# Patient Record
Sex: Female | Born: 1980 | Hispanic: Yes | Marital: Single | Smoking: Never smoker
Health system: Southern US, Community
[De-identification: ages and names within clinical notes are randomized; demographics above are authoritative.]

---

## 2018-07-18 ENCOUNTER — Observation Stay
Admission: EM | Admit: 2018-07-18 | Discharge: 2018-07-19 | Disposition: A | Discharge status: Home / Self Care | Payer: Self-pay | Attending: Surgery | Admitting: Surgery

## 2018-07-18 ENCOUNTER — Emergency Department: Payer: Self-pay

## 2018-07-18 DIAGNOSIS — Z79899 Other long term (current) drug therapy: Secondary | ICD-10-CM | POA: Insufficient documentation

## 2018-07-18 DIAGNOSIS — K8012 Calculus of gallbladder with acute and chronic cholecystitis without obstruction: Principal | ICD-10-CM | POA: Insufficient documentation

## 2018-07-18 DIAGNOSIS — Z881 Allergy status to other antibiotic agents status: Secondary | ICD-10-CM | POA: Insufficient documentation

## 2018-07-18 DIAGNOSIS — K810 Acute cholecystitis: Secondary | ICD-10-CM

## 2018-07-18 DIAGNOSIS — R1013 Epigastric pain: Secondary | ICD-10-CM

## 2018-07-18 LAB — CBC
HEMATOCRIT: 39.8 % (ref 36.0–46.0)
HEMOGLOBIN: 13.4 g/dL (ref 12.0–15.0)
MCH: 30.2 pg (ref 26.0–34.0)
MCHC: 33.7 g/dL (ref 30.0–36.0)
MCV: 89.8 fL (ref 80.0–100.0)
NRBC: 0 % (ref 0.0–0.2)
Platelets: 343 10*3/uL (ref 150–400)
RBC: 4.43 MIL/uL (ref 3.87–5.11)
RDW: 12.9 % (ref 11.5–15.5)
WBC: 14.1 10*3/uL — AB (ref 4.0–10.5)

## 2018-07-18 LAB — COMPREHENSIVE METABOLIC PANEL
ALT: 20 U/L (ref 0–44)
AST: 18 U/L (ref 15–41)
Albumin: 4.4 g/dL (ref 3.5–5.0)
Alkaline Phosphatase: 57 U/L (ref 38–126)
Anion gap: 9 (ref 5–15)
BUN: 7 mg/dL (ref 6–20)
CHLORIDE: 104 mmol/L (ref 98–111)
CO2: 26 mmol/L (ref 22–32)
CREATININE: 0.56 mg/dL (ref 0.44–1.00)
Calcium: 9.1 mg/dL (ref 8.9–10.3)
Glucose, Bld: 116 mg/dL — ABNORMAL HIGH (ref 70–99)
POTASSIUM: 3.6 mmol/L (ref 3.5–5.1)
Sodium: 139 mmol/L (ref 135–145)
Total Bilirubin: 0.5 mg/dL (ref 0.3–1.2)
Total Protein: 7.9 g/dL (ref 6.5–8.1)

## 2018-07-18 LAB — URINALYSIS, COMPLETE (UACMP) WITH MICROSCOPIC
BILIRUBIN URINE: NEGATIVE
Glucose, UA: NEGATIVE mg/dL
KETONES UR: NEGATIVE mg/dL
LEUKOCYTES UA: NEGATIVE
NITRITE: NEGATIVE
PROTEIN: NEGATIVE mg/dL
SPECIFIC GRAVITY, URINE: 1.008 (ref 1.005–1.030)
pH: 6 (ref 5.0–8.0)

## 2018-07-18 LAB — LIPASE, BLOOD: LIPASE: 20 U/L (ref 11–51)

## 2018-07-18 LAB — TROPONIN I

## 2018-07-18 LAB — POCT PREGNANCY, URINE: PREG TEST UR: NEGATIVE

## 2018-07-18 MED ORDER — HYDROMORPHONE HCL 1 MG/ML IJ SOLN
1.0000 mg | Freq: Once | INTRAMUSCULAR | Status: AC
  Administered 2018-07-19: 1 mg via INTRAVENOUS
  Filled 2018-07-18: qty 1

## 2018-07-18 MED ORDER — SODIUM CHLORIDE 0.9 % IV BOLUS
1000.0000 mL | Freq: Once | INTRAVENOUS | Status: AC
  Administered 2018-07-18: 1000 mL via INTRAVENOUS

## 2018-07-18 MED ORDER — SODIUM CHLORIDE 0.9 % IV SOLN
1.0000 g | Freq: Once | INTRAVENOUS | Status: DC
  Filled 2018-07-18: qty 10

## 2018-07-18 MED ORDER — FENTANYL CITRATE (PF) 100 MCG/2ML IJ SOLN: 100.0000 ug | Freq: Once | INTRAMUSCULAR | Status: DC

## 2018-07-18 MED ORDER — ONDANSETRON HCL 4 MG/2ML IJ SOLN
4.0000 mg | Freq: Once | INTRAMUSCULAR | Status: AC
  Administered 2018-07-18: 4 mg via INTRAVENOUS
  Filled 2018-07-18: qty 2

## 2018-07-18 MED ORDER — MORPHINE SULFATE (PF) 4 MG/ML IV SOLN
4.0000 mg | Freq: Once | INTRAVENOUS | Status: AC
  Administered 2018-07-18: 4 mg via INTRAVENOUS
  Filled 2018-07-18: qty 1

## 2018-07-18 MED ORDER — FENTANYL CITRATE (PF) 100 MCG/2ML IJ SOLN
100.0000 ug | Freq: Once | INTRAMUSCULAR | Status: AC
  Administered 2018-07-18: 100 ug via INTRAVENOUS
  Filled 2018-07-18: qty 2

## 2018-07-18 NOTE — H&P (Signed)
Subjective:   CC: acute cholecystitis  HPI:  Elaine Aguilar is a 37 y.o. female who is consulted by goodman for evaluation of above cc.  Symptoms were first noted 2 days ago. Pain is sharp, constant  Associated with nausea, exacerbated by nothing specific.  Pain is unrelanting despite dose of fentanyl and morphine.  Cannot get comfortable    Past Medical History: none  Past Surgical History: none  Family History: non-contributory  Social History:  reports that she has never smoked. She has never used smokeless tobacco. She reports that she drank alcohol. Her drug history is not on file.  Current Medications: denies any use  Allergies:  Allergies as of 07/18/2018 - Review Complete 07/18/2018  Allergen Reaction Noted  . Other  07/18/2018    ROS:  A 15 point review of systems was performed and pertinent positives and negatives noted in HPI    Objective:     BP 131/79   Pulse 65   Temp 98.2 F (36.8 C) (Oral)   Resp 17   Ht 5\' 3"  (1.6 m)   Wt 84.8 kg   LMP 06/20/2018   SpO2 99%   BMI 33.13 kg/m    Constitutional :  alert, cooperative, appears stated age and mild distress  Lymphatics/Throat:  no asymmetry, masses, or scars  Respiratory:  clear to auscultation bilaterally  Cardiovascular:  regular rate and rhythm  Gastrointestinal: soft, but focal tenderness with guarding upon palpation of RUQ and epigastric region with rebound tenderness.   Musculoskeletal: Steady gait and movement  Skin: Cool and moist  Psychiatric: Normal affect, non-agitated, not confused       LABS:  CMP Latest Ref Rng & Units 07/18/2018  Glucose 70 - 99 mg/dL 811(B)  BUN 6 - 20 mg/dL 7  Creatinine 1.47 - 8.29 mg/dL 5.62  Sodium 130 - 865 mmol/L 139  Potassium 3.5 - 5.1 mmol/L 3.6  Chloride 98 - 111 mmol/L 104  CO2 22 - 32 mmol/L 26  Calcium 8.9 - 10.3 mg/dL 9.1  Total Protein 6.5 - 8.1 g/dL 7.9  Total Bilirubin 0.3 - 1.2 mg/dL 0.5  Alkaline Phos 38 - 126 U/L 57  AST 15 - 41 U/L 18  ALT 0 -  44 U/L 20   CBC Latest Ref Rng & Units 07/18/2018  WBC 4.0 - 10.5 K/uL 14.1(H)  Hemoglobin 12.0 - 15.0 g/dL 78.4  Hematocrit 69.6 - 46.0 % 39.8  Platelets 150 - 400 K/uL 343     RADS: CLINICAL DATA:  Epigastric abdominal pain x1 week with nausea.  EXAM: ULTRASOUND ABDOMEN LIMITED RIGHT UPPER QUADRANT  COMPARISON:  None.  FINDINGS: Gallbladder:  Moderate distention of the gallbladder with pericholecystic fluid and gallbladder wall thickening is identified. Impacted gallstone at the gallbladder neck is noted with biliary sludge.  Common bile duct:  Diameter: Mildly distended up to 7.4 mm.  No choledocholithiasis.  Liver:  Echogenic liver parenchyma without mass. Portal vein is patent on color Doppler imaging with normal direction of blood flow towards the liver.  IMPRESSION: Distended gallbladder with impacted gallstone at the neck and biliary sludge noted. Pericholecystic fluid and gallbladder wall thickening is identified. Findings would be in keeping with acute cholecystitis.   Electronically Signed   By: Tollie Eth M.D.   On: 07/18/2018 23:24 Assessment:      Acute cholecystitis  Plan:      Discussed the risk of surgery including post-op infxn, seroma, biloma, chronic pain, poor-delayed wound healing, retained gallstone, conversion to open  procedure, post-op SBO or ileus, and need for additional procedures to address said risks.  The risks of general anesthetic including MI, CVA, sudden death or even reaction to anesthetic medications also discussed. Alternatives include continued observation.  Benefits include possible symptom relief, prevention of complications including acute cholecystitis, pancreatitis.  Typical post operative recovery of 3-5 days rest, continued pain in area and incision sites, possible loose stools up to 4-6 weeks, also discussed.  The patient understands the risks, any and all questions were answered to the patient's  satisfaction.  Pt is in mild distress and unable to get comfortable despite dose of morphine and fentanyl.  Pt denies any previous narcotic use and alcohol use so do not believe she is narcotic tolerant, not malingering.  I do not feel we can obtain adequate pain control overnight, so will proceed with lap chole tonight.    Allergic to ceftriaxone so cipro/flagyl will be given for preopabx

## 2018-07-18 NOTE — ED Notes (Signed)
Patient transported to Ultrasound 

## 2018-07-18 NOTE — ED Notes (Signed)
Urine Preg is negative.  

## 2018-07-18 NOTE — ED Provider Notes (Signed)
[facility] Emergency Department Provider Note  _________________________________________   I have reviewed the triage vital signs and the nursing notes.   HISTORY  Chief Complaint Abdominal Pain   History limited by: Not Limited   HPI Elaine Aguilar is a 37 y.o. female who presents to the emergency department today because of concerns for abdominal pain.  Patient states that the pain started 2 days ago.  Located in the epigastric region.  It does radiate into her back.  She states that the pain had gotten somewhat better this morning however became worse again after she ate.  This has been accompanied by nausea and vomiting.  No grossly bloody vomit.  She denies any significant change in defecation.  She denies similar pain in the past.  Patient denies any fevers.   Per medical record review patient has a history of nausea and diarrhea in the past.  History reviewed. No pertinent past medical history.  There are no active problems to display for this patient.   History reviewed. No pertinent surgical history.  Prior to Admission medications   Not on File    Allergies Other  No family history on file.  Social History Social History   Tobacco Use  . Smoking status: Never Smoker  . Smokeless tobacco: Never Used  Substance Use Topics  . Alcohol use: Not Currently  . Drug use: Not on file    Review of Systems Constitutional: No fever/chills Eyes: No visual changes. ENT: No sore throat. Cardiovascular: Denies chest pain. Respiratory: Denies shortness of breath. Gastrointestinal: Positive for abdominal pain, nausea, vomiting.  Genitourinary: Negative for dysuria. Musculoskeletal: Negative for back pain. Skin: Negative for rash. Neurological: Negative for headaches, focal weakness or numbness.  ____________________________________________   PHYSICAL EXAM:  VITAL SIGNS: ED Triage Vitals  Enc Vitals Group     BP 07/18/18 2024 (!) 155/89      Pulse Rate 07/18/18 2024 72     Resp 07/18/18 2024 20     Temp 07/18/18 2024 98.2 F (36.8 C)     Temp Source 07/18/18 2024 Oral     SpO2 07/18/18 2024 100 %     Weight 07/18/18 2021 187 lb (84.8 kg)     Height 07/18/18 2021 5\' 3"  (1.6 m)     Head Circumference --      Peak Flow --      Pain Score 07/18/18 2021 10   Constitutional: Alert and oriented.  Eyes: Conjunctivae are normal.  ENT      Head: Normocephalic and atraumatic.      Nose: No congestion/rhinnorhea.      Mouth/Throat: Mucous membranes are moist.      Neck: No stridor. Hematological/Lymphatic/Immunilogical: No cervical lymphadenopathy. Cardiovascular: Normal rate, regular rhythm.  No murmurs, rubs, or gallops.  Respiratory: Normal respiratory effort without tachypnea nor retractions. Breath sounds are clear and equal bilaterally. No wheezes/rales/rhonchi. Gastrointestinal: Soft and tender to palpation in the epigastric region. No rebound. No guarding.  Genitourinary: Deferred Musculoskeletal: Normal range of motion in all extremities. No lower extremity edema. Neurologic:  Normal speech and language. No gross focal neurologic deficits are appreciated.  Skin:  Skin is warm, dry and intact. No rash noted. Psychiatric: Mood and affect are normal. Speech and behavior are normal. Patient exhibits appropriate insight and judgment.  ____________________________________________    LABS (pertinent positives/negatives)  Trop <0.03 Upreg neg Lipase 20 CBC wbc 14.1, hgb 13.4, plt 343 CMP wnl except glu 116 UA clear, mod hgb dipstick, 0-5  wbc and rbc  ____________________________________________   EKG  I, Phineas Semen, attending physician, personally viewed and interpreted this EKG  EKG Time: 2024 Rate: 69 Rhythm: normal sinus rhythm Axis: normal Intervals: qtc 415 QRS: narrow ST changes: no st elevation Impression: normal ekg  ____________________________________________    RADIOLOGY  [place]  RUQ Consistent with acute cholecystitis  ____________________________________________   PROCEDURES  Procedures  ____________________________________________   INITIAL IMPRESSION / ASSESSMENT AND PLAN / ED COURSE  Pertinent labs & imaging results that were available during my care of the patient were reviewed by me and considered in my medical decision making (see chart for details).   Presented to the emergency department today with concerns for epigastric abdominal pain.  On exam patient is tender in the epigastric region.  Patient had a mild leukocytosis.  Concern for gallbladder pathology.  Ultrasound was performed which was consistent with acute cholecystitis.  Discussed with Dr. Tonna Boehringer with surgery.  Discussed findings and plan for admission with patient.   ____________________________________________   FINAL CLINICAL IMPRESSION(S) / ED DIAGNOSES  Final diagnoses:  Epigastric abdominal pain  Acute cholecystitis     Note: This dictation was prepared with Dragon dictation. Any transcriptional errors that result from this process are unintentional     Phineas Semen, MD 07/18/18 2336

## 2018-07-18 NOTE — ED Triage Notes (Signed)
Patient c/o epigastric pain X 1 week. Patient saw MD Tuesday and was prescribed omeprazole. patient reports pain radiates to upper medial back. Patient reports accompanying symptoms of nausea.

## 2018-07-19 ENCOUNTER — Encounter: Payer: Self-pay | Admitting: Surgery

## 2018-07-19 ENCOUNTER — Emergency Department: Payer: Self-pay | Admitting: Anesthesiology

## 2018-07-19 ENCOUNTER — Other Ambulatory Visit: Payer: Self-pay

## 2018-07-19 ENCOUNTER — Encounter
Admission: EM | Disposition: A | Discharge status: Home / Self Care | Payer: Self-pay | Source: Home / Self Care | Attending: Emergency Medicine

## 2018-07-19 DIAGNOSIS — K810 Acute cholecystitis: Secondary | ICD-10-CM | POA: Diagnosis present

## 2018-07-19 HISTORY — PX: CHOLECYSTECTOMY: SHX55

## 2018-07-19 LAB — CBC
HEMATOCRIT: 38.6 % (ref 36.0–46.0)
HEMOGLOBIN: 13 g/dL (ref 12.0–15.0)
MCH: 30.5 pg (ref 26.0–34.0)
MCHC: 33.7 g/dL (ref 30.0–36.0)
MCV: 90.6 fL (ref 80.0–100.0)
Platelets: 304 10*3/uL (ref 150–400)
RBC: 4.26 MIL/uL (ref 3.87–5.11)
RDW: 13 % (ref 11.5–15.5)
WBC: 15 10*3/uL — ABNORMAL HIGH (ref 4.0–10.5)
nRBC: 0 % (ref 0.0–0.2)

## 2018-07-19 LAB — CREATININE, SERUM
Creatinine, Ser: 0.39 mg/dL — ABNORMAL LOW (ref 0.44–1.00)
GFR calc Af Amer: >60 mL/min (ref >60)
GFR calc non Af Amer: >60 mL/min (ref >60)

## 2018-07-19 SURGERY — LAPAROSCOPIC CHOLECYSTECTOMY
Anesthesia: General | Site: Abdomen

## 2018-07-19 MED ORDER — FENTANYL CITRATE (PF) 250 MCG/5ML IJ SOLN
INTRAMUSCULAR | Status: AC
  Filled 2018-07-19: qty 5

## 2018-07-19 MED ORDER — LACTATED RINGERS IV SOLN
INTRAVENOUS | Status: DC | PRN
  Administered 2018-07-19: 01:00:00 via INTRAVENOUS

## 2018-07-19 MED ORDER — LACTATED RINGERS IV SOLN: INTRAVENOUS | Status: DC

## 2018-07-19 MED ORDER — LIDOCAINE-EPINEPHRINE (PF) 1 %-1:200000 IJ SOLN
INTRAMUSCULAR | Status: DC | PRN
  Administered 2018-07-19: 26 mL via INTRADERMAL

## 2018-07-19 MED ORDER — ONDANSETRON HCL 4 MG/2ML IJ SOLN: 4.0000 mg | Freq: Four times a day (QID) | INTRAMUSCULAR | Status: DC | PRN

## 2018-07-19 MED ORDER — DOCUSATE SODIUM 100 MG PO CAPS: 100.0000 mg | ORAL_CAPSULE | Freq: Two times a day (BID) | ORAL | 0 refills | Status: AC | PRN

## 2018-07-19 MED ORDER — FENTANYL CITRATE (PF) 100 MCG/2ML IJ SOLN
INTRAMUSCULAR | Status: DC | PRN
  Administered 2018-07-19: 25 ug via INTRAVENOUS
  Administered 2018-07-19 (×3): 50 ug via INTRAVENOUS

## 2018-07-19 MED ORDER — ONDANSETRON 4 MG PO TBDP: 4.0000 mg | ORAL_TABLET | Freq: Four times a day (QID) | ORAL | Status: DC | PRN

## 2018-07-19 MED ORDER — MORPHINE SULFATE (PF) 2 MG/ML IV SOLN
2.0000 mg | INTRAVENOUS | Status: DC | PRN
  Administered 2018-07-19: 2 mg via INTRAVENOUS
  Filled 2018-07-19: qty 1

## 2018-07-19 MED ORDER — ACETAMINOPHEN 10 MG/ML IV SOLN
INTRAVENOUS | Status: AC
  Filled 2018-07-19: qty 100

## 2018-07-19 MED ORDER — IBUPROFEN 600 MG PO TABS: 600.0000 mg | ORAL_TABLET | Freq: Four times a day (QID) | ORAL | 0 refills | Status: DC | PRN

## 2018-07-19 MED ORDER — ENOXAPARIN SODIUM 40 MG/0.4ML ~~LOC~~ SOLN: 40.0000 mg | SUBCUTANEOUS | Status: DC

## 2018-07-19 MED ORDER — ONDANSETRON HCL 4 MG/2ML IJ SOLN
INTRAMUSCULAR | Status: DC | PRN
  Administered 2018-07-19: 4 mg via INTRAVENOUS

## 2018-07-19 MED ORDER — METRONIDAZOLE IN NACL 5-0.79 MG/ML-% IV SOLN
INTRAVENOUS | Status: AC
  Filled 2018-07-19: qty 100

## 2018-07-19 MED ORDER — ACETAMINOPHEN 325 MG PO TABS: 650.0000 mg | ORAL_TABLET | Freq: Three times a day (TID) | ORAL | 0 refills | Status: AC | PRN

## 2018-07-19 MED ORDER — ONDANSETRON HCL 4 MG/2ML IJ SOLN
INTRAMUSCULAR | Status: AC
  Filled 2018-07-19: qty 2

## 2018-07-19 MED ORDER — ROCURONIUM BROMIDE 50 MG/5ML IV SOLN
INTRAVENOUS | Status: AC
  Filled 2018-07-19: qty 1

## 2018-07-19 MED ORDER — SUGAMMADEX SODIUM 200 MG/2ML IV SOLN
INTRAVENOUS | Status: AC
  Filled 2018-07-19: qty 2

## 2018-07-19 MED ORDER — DEXAMETHASONE SODIUM PHOSPHATE 10 MG/ML IJ SOLN
INTRAMUSCULAR | Status: AC
  Filled 2018-07-19: qty 1

## 2018-07-19 MED ORDER — CIPROFLOXACIN IN D5W 400 MG/200ML IV SOLN
INTRAVENOUS | Status: AC
  Filled 2018-07-19: qty 200

## 2018-07-19 MED ORDER — SEVOFLURANE IN SOLN
RESPIRATORY_TRACT | Status: AC
  Filled 2018-07-19: qty 250

## 2018-07-19 MED ORDER — HYDROCODONE-ACETAMINOPHEN 5-325 MG PO TABS
1.0000 | ORAL_TABLET | ORAL | Status: DC | PRN
  Administered 2018-07-19 (×2): 1 via ORAL
  Administered 2018-07-19: 2 via ORAL
  Filled 2018-07-19: qty 1
  Filled 2018-07-19: qty 2
  Filled 2018-07-19: qty 1

## 2018-07-19 MED ORDER — IBUPROFEN 400 MG PO TABS: 600.0000 mg | ORAL_TABLET | Freq: Four times a day (QID) | ORAL | Status: DC | PRN

## 2018-07-19 MED ORDER — MIDAZOLAM HCL 2 MG/2ML IJ SOLN
INTRAMUSCULAR | Status: AC
  Filled 2018-07-19: qty 2

## 2018-07-19 MED ORDER — SODIUM CHLORIDE 0.9 % IV SOLN
INTRAVENOUS | Status: DC | PRN
  Administered 2018-07-19: 250 mL via INTRAVENOUS

## 2018-07-19 MED ORDER — HYDROCODONE-ACETAMINOPHEN 5-325 MG PO TABS: 1.0000 | ORAL_TABLET | ORAL | 0 refills | Status: DC | PRN

## 2018-07-19 MED ORDER — LIDOCAINE HCL (PF) 2 % IJ SOLN
INTRAMUSCULAR | Status: AC
  Filled 2018-07-19: qty 10

## 2018-07-19 MED ORDER — TRAMADOL HCL 50 MG PO TABS: 50.0000 mg | ORAL_TABLET | Freq: Four times a day (QID) | ORAL | Status: DC | PRN

## 2018-07-19 MED ORDER — FAMOTIDINE IN NACL 20-0.9 MG/50ML-% IV SOLN
20.0000 mg | Freq: Two times a day (BID) | INTRAVENOUS | Status: DC
  Administered 2018-07-19: 20 mg via INTRAVENOUS
  Filled 2018-07-19 (×2): qty 50

## 2018-07-19 MED ORDER — PROPOFOL 10 MG/ML IV BOLUS
INTRAVENOUS | Status: DC | PRN
  Administered 2018-07-19: 160 mg via INTRAVENOUS

## 2018-07-19 MED ORDER — FENTANYL CITRATE (PF) 100 MCG/2ML IJ SOLN: 25.0000 ug | INTRAMUSCULAR | Status: DC | PRN

## 2018-07-19 MED ORDER — CIPROFLOXACIN IN D5W 400 MG/200ML IV SOLN
400.0000 mg | Freq: Two times a day (BID) | INTRAVENOUS | Status: DC
  Administered 2018-07-19: 400 mg via INTRAVENOUS
  Filled 2018-07-19: qty 200

## 2018-07-19 MED ORDER — ROCURONIUM BROMIDE 100 MG/10ML IV SOLN
INTRAVENOUS | Status: DC | PRN
  Administered 2018-07-19: 40 mg via INTRAVENOUS
  Administered 2018-07-19: 20 mg via INTRAVENOUS

## 2018-07-19 MED ORDER — METRONIDAZOLE IN NACL 5-0.79 MG/ML-% IV SOLN
500.0000 mg | Freq: Three times a day (TID) | INTRAVENOUS | Status: DC
  Administered 2018-07-19: 500 mg via INTRAVENOUS
  Filled 2018-07-19: qty 100

## 2018-07-19 MED ORDER — DEXAMETHASONE SODIUM PHOSPHATE 10 MG/ML IJ SOLN
INTRAMUSCULAR | Status: DC | PRN
  Administered 2018-07-19: 10 mg via INTRAVENOUS

## 2018-07-19 MED ORDER — SUGAMMADEX SODIUM 200 MG/2ML IV SOLN
INTRAVENOUS | Status: DC | PRN
  Administered 2018-07-19: 170 mg via INTRAVENOUS

## 2018-07-19 MED ORDER — ONDANSETRON HCL 4 MG/2ML IJ SOLN: 4.0000 mg | Freq: Once | INTRAMUSCULAR | Status: DC | PRN

## 2018-07-19 MED ORDER — BUPIVACAINE-EPINEPHRINE (PF) 0.5% -1:200000 IJ SOLN
INTRAMUSCULAR | Status: AC
  Filled 2018-07-19: qty 30

## 2018-07-19 MED ORDER — LIDOCAINE HCL (PF) 1 % IJ SOLN
INTRAMUSCULAR | Status: AC
  Filled 2018-07-19: qty 30

## 2018-07-19 MED ORDER — PROPOFOL 10 MG/ML IV BOLUS
INTRAVENOUS | Status: AC
  Filled 2018-07-19: qty 20

## 2018-07-19 MED ORDER — SUCCINYLCHOLINE CHLORIDE 20 MG/ML IJ SOLN
INTRAMUSCULAR | Status: AC
  Filled 2018-07-19: qty 1

## 2018-07-19 MED ORDER — ACETAMINOPHEN 10 MG/ML IV SOLN
INTRAVENOUS | Status: DC | PRN
  Administered 2018-07-19: 1000 mg via INTRAVENOUS

## 2018-07-19 MED ORDER — LIDOCAINE HCL (CARDIAC) PF 100 MG/5ML IV SOSY
PREFILLED_SYRINGE | INTRAVENOUS | Status: DC | PRN
  Administered 2018-07-19: 30 mg via INTRAVENOUS

## 2018-07-19 MED ORDER — MIDAZOLAM HCL 2 MG/2ML IJ SOLN
INTRAMUSCULAR | Status: DC | PRN
  Administered 2018-07-19: 2 mg via INTRAVENOUS

## 2018-07-19 SURGICAL SUPPLY — 56 items
APPLICATOR COTTON TIP 6 STRL (MISCELLANEOUS) IMPLANT
APPLICATOR COTTON TIP 6IN STRL (MISCELLANEOUS)
APPLIER CLIP 5 13 M/L LIGAMAX5 (MISCELLANEOUS) ×6
BLADE SURG SZ11 CARB STEEL (BLADE) ×3 IMPLANT
CANISTER SUCT 1200ML W/VALVE (MISCELLANEOUS) ×3 IMPLANT
CHLORAPREP W/TINT 26ML (MISCELLANEOUS) ×3 IMPLANT
CHOLANGIOGRAM CATH TAUT (CATHETERS) IMPLANT
CLIP APPLIE 5 13 M/L LIGAMAX5 (MISCELLANEOUS) ×2 IMPLANT
COVER WAND RF STERILE (DRAPES) IMPLANT
DECANTER SPIKE VIAL GLASS SM (MISCELLANEOUS) ×3 IMPLANT
DEFOGGER SCOPE WARMER CLEARIFY (MISCELLANEOUS) ×3 IMPLANT
DERMABOND ADVANCED (GAUZE/BANDAGES/DRESSINGS) ×2
DERMABOND ADVANCED .7 DNX12 (GAUZE/BANDAGES/DRESSINGS) ×1 IMPLANT
DISSECTOR BLUNT TIP ENDO 5MM (MISCELLANEOUS) ×3 IMPLANT
DISSECTOR KITTNER STICK (MISCELLANEOUS) ×1 IMPLANT
DISSECTORS/KITTNER STICK (MISCELLANEOUS) ×3
DRAPE C-ARM XRAY 36X54 (DRAPES) IMPLANT
DRAPE SHEET LG 3/4 BI-LAMINATE (DRAPES) IMPLANT
ELECT CAUTERY BLADE 6.4 (BLADE) ×3 IMPLANT
ELECT REM PT RETURN 9FT ADLT (ELECTROSURGICAL) ×3
ELECTRODE REM PT RTRN 9FT ADLT (ELECTROSURGICAL) ×1 IMPLANT
GLOVE BIOGEL PI IND STRL 7.0 (GLOVE) ×2 IMPLANT
GLOVE BIOGEL PI INDICATOR 7.0 (GLOVE) ×4
GLOVE SURG SYN 7.0 (GLOVE) ×6 IMPLANT
GOWN STRL REUS W/ TWL LRG LVL3 (GOWN DISPOSABLE) ×2 IMPLANT
GOWN STRL REUS W/TWL LRG LVL3 (GOWN DISPOSABLE) ×4
GRASPER SUT TROCAR 14GX15 (MISCELLANEOUS) ×3 IMPLANT
IRRIGATION STRYKERFLOW (MISCELLANEOUS) ×1 IMPLANT
IRRIGATOR STRYKERFLOW (MISCELLANEOUS) ×3
IV CATH ANGIO 12GX3 LT BLUE (NEEDLE) IMPLANT
IV NS 1000ML (IV SOLUTION) ×2
IV NS 1000ML BAXH (IV SOLUTION) ×1 IMPLANT
JACKSON PRATT 10 (INSTRUMENTS) IMPLANT
L-HOOK LAP DISP 36CM (ELECTROSURGICAL) ×3
LHOOK LAP DISP 36CM (ELECTROSURGICAL) ×1 IMPLANT
NEEDLE HYPO 22GX1.5 SAFETY (NEEDLE) ×3 IMPLANT
PACK LAP CHOLECYSTECTOMY (MISCELLANEOUS) ×3 IMPLANT
PENCIL ELECTRO HAND CTR (MISCELLANEOUS) ×3 IMPLANT
PORT ACCESS TROCAR AIRSEAL 5 (TROCAR) ×3 IMPLANT
POUCH SPECIMEN RETRIEVAL 10MM (ENDOMECHANICALS) IMPLANT
SCISSORS METZENBAUM CVD 33 (INSTRUMENTS) IMPLANT
SET TRI-LUMEN FLTR TB AIRSEAL (TUBING) ×3 IMPLANT
SLEEVE ENDOPATH XCEL 5M (ENDOMECHANICALS) ×3 IMPLANT
SPONGE LAP 18X18 RF (DISPOSABLE) ×3 IMPLANT
STOPCOCK 4 WAY LG BORE MALE ST (IV SETS) IMPLANT
SUT MNCRL 4-0 (SUTURE) ×2
SUT MNCRL 4-0 27XMFL (SUTURE) ×1
SUT VIC AB 3-0 SH 27 (SUTURE)
SUT VIC AB 3-0 SH 27X BRD (SUTURE) IMPLANT
SUT VICRYL 0 AB UR-6 (SUTURE) ×3 IMPLANT
SUTURE MNCRL 4-0 27XMF (SUTURE) ×1 IMPLANT
SYR 20CC LL (SYRINGE) ×3 IMPLANT
TOWEL OR 17X26 4PK STRL BLUE (TOWEL DISPOSABLE) IMPLANT
TROCAR XCEL BLUNT TIP 100MML (ENDOMECHANICALS) ×3 IMPLANT
TROCAR XCEL NON-BLD 5MMX100MML (ENDOMECHANICALS) ×6 IMPLANT
WATER STERILE IRR 1000ML POUR (IV SOLUTION) ×3 IMPLANT

## 2018-07-19 NOTE — Anesthesia Preprocedure Evaluation (Signed)
Anesthesia Evaluation  Patient identified by MRN, date of birth, ID band Patient awake    Reviewed: Allergy & Precautions, H&P , NPO status , Patient's Chart, lab work & pertinent test results, reviewed documented beta blocker date and time   Airway Mallampati: II  TM Distance: >3 FB Neck ROM: full    Dental  (+) Teeth Intact   Pulmonary neg pulmonary ROS,    Pulmonary exam normal        Cardiovascular negative cardio ROS Normal cardiovascular exam Rhythm:regular Rate:Normal     Neuro/Psych negative neurological ROS  negative psych ROS   GI/Hepatic negative GI ROS, Neg liver ROS,   Endo/Other  negative endocrine ROS  Renal/GU negative Renal ROS  negative genitourinary   Musculoskeletal   Abdominal   Peds  Hematology negative hematology ROS (+)   Anesthesia Other Findings History reviewed. No pertinent past medical history. History reviewed. No pertinent surgical history. BMI    Body Mass Index:  33.13 kg/m     Reproductive/Obstetrics negative OB ROS                             Anesthesia Physical Anesthesia Plan  ASA: II and emergent  Anesthesia Plan: General ETT   Post-op Pain Management:    Induction:   PONV Risk Score and Plan:   Airway Management Planned:   Additional Equipment:   Intra-op Plan:   Post-operative Plan:   Informed Consent: I have reviewed the patients History and Physical, chart, labs and discussed the procedure including the risks, benefits and alternatives for the proposed anesthesia with the patient or authorized representative who has indicated his/her understanding and acceptance.   Dental Advisory Given  Plan Discussed with: CRNA  Anesthesia Plan Comments:         Anesthesia Quick Evaluation

## 2018-07-19 NOTE — Progress Notes (Signed)
07/19/2018 6:15 PM  Elaine Aguilar to be D/C'd Home per MD order.  Discussed prescriptions and follow up appointments with the patient. Prescriptions given to patient, medication list explained in detail. Pt verbalized understanding.  Allergies as of 07/19/2018      Reactions   Ceftriaxone Hives   Other    Unknown antibiotic      Medication List    TAKE these medications   acetaminophen 325 MG tablet Commonly known as:  TYLENOL Take 2 tablets (650 mg total) by mouth every 8 (eight) hours as needed for mild pain.   docusate sodium 100 MG capsule Commonly known as:  COLACE Take 1 capsule (100 mg total) by mouth 2 (two) times daily as needed for up to 10 days for mild constipation.   HYDROcodone-acetaminophen 5-325 MG tablet Commonly known as:  NORCO/VICODIN Take 1-2 tablets by mouth every 4 (four) hours as needed for moderate pain.   ibuprofen 600 MG tablet Commonly known as:  ADVIL,MOTRIN Take 1 tablet (600 mg total) by mouth every 6 (six) hours as needed (for mild pain not relieved by other medications.).       Vitals:   07/19/18 0925 07/19/18 1349  BP: 115/69 106/69  Pulse: 72 (!) 56  Resp: 20 20  Temp: 97.8 F (36.6 C) 97.8 F (36.6 C)  SpO2: 98% 98%    Skin clean, dry and intact without evidence of skin break down, no evidence of skin tears noted. IV catheter discontinued intact. Site without signs and symptoms of complications. Dressing and pressure applied. Pt denies pain at this time. No complaints noted.  An After Visit Summary was printed and given to the patient. Patient escorted via WC, and D/C home via private auto.  Bradly Chris

## 2018-07-19 NOTE — Discharge Summary (Signed)
Physician Discharge Summary  Patient ID: Elaine Aguilar MRN: [id number] DOB/AGE: [birth year] 36 y.o.  Admit date: 07/18/2018 Discharge date: 07/19/2018  Admission Diagnoses: acute cholecystitis  Discharge Diagnoses:  Same as above  Discharged Condition: good  Hospital Course: admitted for acute cholecystitis and taken to OR.  Please see OP note for details.  Recovered well afterwards.  Advanced to low fat, pain controlled with oral meds so deemed ok to d/c.  Consults: None  Discharge Exam: Blood pressure 106/69, pulse (!) 56, temperature 97.8 F (36.6 C), temperature source Oral, resp. rate 20, height 5\' 3"  (1.6 m), weight 84.8 kg, last menstrual period 06/20/2018, SpO2 98 %. General appearance: alert, cooperative and no distress GI: soft, non-tender; bowel sounds normal; no masses,  no organomegaly and incisions c/d/i  Disposition:  Discharge disposition: 01-Home or Self Care       Discharge Instructions    Discharge patient   Complete by:  As directed    Discharge disposition:  01-Home or Self Care   Discharge patient date:  07/19/2018     Allergies as of 07/19/2018      Reactions   Ceftriaxone Hives   Other    Unknown antibiotic      Medication List    TAKE these medications   acetaminophen 325 MG tablet Commonly known as:  TYLENOL Take 2 tablets (650 mg total) by mouth every 8 (eight) hours as needed for mild pain.   docusate sodium 100 MG capsule Commonly known as:  COLACE Take 1 capsule (100 mg total) by mouth 2 (two) times daily as needed for up to 10 days for mild constipation.   HYDROcodone-acetaminophen 5-325 MG tablet Commonly known as:  NORCO/VICODIN Take 1-2 tablets by mouth every 4 (four) hours as needed for moderate pain.   ibuprofen 600 MG tablet Commonly known as:  ADVIL,MOTRIN Take 1 tablet (600 mg total) by mouth every 6 (six) hours as needed (for mild pain not relieved by other medications.).      Follow-up Information    Tonna Boehringer,  Tanika Bracco, DO Follow up in 2 week(s).   Specialty:  Surgery Contact information: [address] [place] [address] [phone]            Total time spent arranging discharge was >1min. Signed: Sung Amabile 07/19/2018, 4:05 PM

## 2018-07-19 NOTE — Anesthesia Post-op Follow-up Note (Signed)
Anesthesia QCDR form completed.        

## 2018-07-19 NOTE — Anesthesia Procedure Notes (Signed)
Procedure Name: Intubation Date/Time: 07/19/2018 1:21 AM Performed by: Lendon Colonel, CRNA Pre-anesthesia Checklist: Patient identified, Patient being monitored, Timeout performed, Emergency Drugs available and Suction available Patient Re-evaluated:Patient Re-evaluated prior to induction Oxygen Delivery Method: Circle system utilized Preoxygenation: Pre-oxygenation with 100% oxygen Induction Type: IV induction Ventilation: Mask ventilation without difficulty Laryngoscope Size: Mac and 3 Grade View: Grade I Tube type: Oral Tube size: 7.0 mm Number of attempts: 1 Airway Equipment and Method: Stylet Placement Confirmation: ETT inserted through vocal cords under direct vision,  positive ETCO2 and breath sounds checked- equal and bilateral Secured at: 19 cm Tube secured with: Tape Dental Injury: Teeth and Oropharynx as per pre-operative assessment

## 2018-07-19 NOTE — Progress Notes (Signed)
Patient arrived to room 230; lethargic, but easily arousable; crying when she realized her family was still here, telling them to go home; weak in the legs and her head lolling back and forth; encouraged deep breaths frequently; VSS; assisted to [facility] to void after obtaining BP/P prior to getting up; noted to have small blood clots in Urine; when asked if she was on her period, she stated that she was "about to start"; assisted back to bed and instructed patient to deep breathe and to lay down on her side, use her arms and legs and roll over to minimize abdominal pain; splint pillow given; IS at bedside, to instruct when more alert; call bell in reach; bed alarm activated; instructed to call to get up; tolerated small sips of water; Windy Carina, RN 5:25 AM 07/19/2018

## 2018-07-19 NOTE — Transfer of Care (Signed)
Immediate Anesthesia Transfer of Care Note  Patient: Elaine Aguilar  Procedure(s) Performed: LAPAROSCOPIC CHOLECYSTECTOMY (N/A Abdomen)  Patient Location: PACU  Anesthesia Type:General  Level of Consciousness: sedated  Airway & Oxygen Therapy: Patient Spontanous Breathing and Patient connected to face mask oxygen  Post-op Assessment: Report given to RN and Post -op Vital signs reviewed and stable  Post vital signs: Reviewed and stable  Last Vitals:  Vitals Value Taken Time  BP 113/63 07/19/2018  3:27 AM  Temp 36.4 C 07/19/2018  3:27 AM  Pulse 68 07/19/2018  3:30 AM  Resp 17 07/19/2018  3:30 AM  SpO2 100 % 07/19/2018  3:30 AM  Vitals shown include unvalidated device data.  Last Pain:  Vitals:   07/19/18 0327  TempSrc:   PainSc: Asleep         Complications: No apparent anesthesia complications

## 2018-07-19 NOTE — Op Note (Signed)
Preoperative diagnosis:  acute and cholecystitis  Postoperative diagnosis: same as above  Procedure: Laparoscopic Cholecystectomy.   Anesthesia: GETA   Surgeon: Sung Amabile  Specimen: Gallbladder  Complications: None  EBL: 15mL  Wound Classification: Clean Contaminated  Indications: see HPI  Findings: Critical view of safety noted Cystic duct and artery identified, ligated and divided, clips remained intact at end of procedure Adequate hemostasis  Description of procedure: The patient was placed on the operating table in the supine position. SCDs placed, pre-op abx administered.  General anesthesia was induced and OG tube placed by anesthesia. A time-out was completed verifying correct patient, procedure, site, positioning, and implant(s) and/or special equipment prior to beginning this procedure. The abdomen was prepped and draped in the usual sterile fashion.  An incision was made in a natural skin line under the umbilicus.  Dissection carried down to fascia where two 0 vicryl sutures placed to use as anchor sutures for hasson port.  Incision made into fascia and blunt dissection used to enter peritoneum.  Hasson port placed and insufflation started up to 15mm Hg without any dramatic increase in pressure.    The laparoscope was inserted and the abdomen inspected. No injuries from initial trocar placement were noted. Additional trocars were then inserted under direct visualization in the following locations: a 5-mm trocar in the subxyphoid region and two 5-mm trocars along the right costal margin. The abdomen was inspected and no abnormalities or injuries were found. The table was placed in the reverse Trendelenburg position with the right side up.  Filmy adhesions between the gallbladder and omentum, duodenum and transverse colon were lysed sharply. The dome of the gallbladder was grasped with an atraumatic grasper passed through the lateral port and retracted over the dome of the  liver. The infundibulum was also grasped with an atraumatic grasper and retracted toward the right lower quadrant. This maneuver exposed Calot's triangle. The very thick peritoneum overlying the gallbladder infundibulum was then dissected and the cystic duct and cystic artery identified.  Critical view of safety with the liver bed clearly visible behind the duct and artery with no additional structures noted.  The cystic duct and cystic artery clipped and divided close to the gallbladder.  The gallbladder was then dissected from its peritoneal and liver bed attachments by electrocautery.  Enlarged peritoneal vessels were clipped and ligated as needed during this portion.  The GB wall was entered several times due to lack of a visible plane from the extensive edema, so the gallbladder removal from wall switched to a top down approach, and this ensured removal of the gallbladder entirely from fossa.   Hemostasis was checked and the gallbladder was removed using an endoscopic retrieval bag placed through the umbilical port. The gallbladder was passed off the table as a specimen. The gallbladder fossa was copiously irrigated with saline and any leaked bile was suctioned out, and hemostasis was obtained. There was no evidence of bleeding from the gallbladder fossa or cystic artery or leakage of the bile from the cystic duct stump. Abdomen desufflated and secondary trocars were removed under direct vision. No bleeding was noted. The laparoscope was withdrawn and the umbilical trocar removed.  The fascia of the Hasson trocar site was closed with figure-of-eight 0 vicryl sutures.  3-0 vicryl used to close deep dermal layer at umbilical site.  All skin incisions then closed with subcuticular sutures of 4-0 monocryl and dressed with topical skin adhesive. The orogastric tube was removed and patient extubated. The patient tolerated the  procedure well and was taken to the postanesthesia care unit in stable condition.  All  sponge and instrument count correct at end of procedure.

## 2018-07-19 NOTE — ED Notes (Signed)
Pt. Taken to OR

## 2018-07-20 ENCOUNTER — Other Ambulatory Visit: Payer: Self-pay | Admitting: Surgery

## 2018-07-20 LAB — SURGICAL PATHOLOGY

## 2018-07-20 MED ORDER — HYDROCODONE-ACETAMINOPHEN 5-325 MG PO TABS: 1.0000 | ORAL_TABLET | Freq: Four times a day (QID) | ORAL | 0 refills | Status: AC | PRN

## 2018-07-24 NOTE — Anesthesia Postprocedure Evaluation (Signed)
Anesthesia Post Note  Patient: Laketha Leopard  Procedure(s) Performed: LAPAROSCOPIC CHOLECYSTECTOMY (N/A Abdomen)  Patient location during evaluation: PACU Anesthesia Type: General Level of consciousness: awake and alert Pain management: pain level controlled Vital Signs Assessment: post-procedure vital signs reviewed and stable Respiratory status: spontaneous breathing, nonlabored ventilation, respiratory function stable and patient connected to nasal cannula oxygen Cardiovascular status: blood pressure returned to baseline and stable Postop Assessment: no apparent nausea or vomiting Anesthetic complications: no     Last Vitals:  Vitals:   07/19/18 0925 07/19/18 1349  BP: 115/69 106/69  Pulse: 72 (!) 56  Resp: 20 20  Temp: 36.6 C 36.6 C  SpO2: 98% 98%    Last Pain:  Vitals:   07/19/18 1637  TempSrc:   PainSc: 4                  Yevette Edwards

## 2021-02-20 ENCOUNTER — Other Ambulatory Visit: Payer: Self-pay

## 2021-02-20 ENCOUNTER — Emergency Department: Payer: Self-pay

## 2021-02-20 ENCOUNTER — Observation Stay: Payer: Self-pay

## 2021-02-20 ENCOUNTER — Observation Stay
Admission: EM | Admit: 2021-02-20 | Discharge: 2021-02-22 | Disposition: A | Discharge status: Home / Self Care | Payer: Self-pay | Attending: Internal Medicine | Admitting: Internal Medicine

## 2021-02-20 DIAGNOSIS — R7401 Elevation of levels of liver transaminase levels: Secondary | ICD-10-CM | POA: Insufficient documentation

## 2021-02-20 DIAGNOSIS — F4321 Adjustment disorder with depressed mood: Secondary | ICD-10-CM

## 2021-02-20 DIAGNOSIS — U071 COVID-19: Secondary | ICD-10-CM | POA: Insufficient documentation

## 2021-02-20 DIAGNOSIS — G459 Transient cerebral ischemic attack, unspecified: Secondary | ICD-10-CM | POA: Insufficient documentation

## 2021-02-20 DIAGNOSIS — F449 Dissociative and conversion disorder, unspecified: Secondary | ICD-10-CM

## 2021-02-20 DIAGNOSIS — G9341 Metabolic encephalopathy: Principal | ICD-10-CM

## 2021-02-20 DIAGNOSIS — R29818 Other symptoms and signs involving the nervous system: Secondary | ICD-10-CM

## 2021-02-20 DIAGNOSIS — R059 Cough, unspecified: Secondary | ICD-10-CM

## 2021-02-20 LAB — URINE DRUG SCREEN, QUALITATIVE (ARMC ONLY)
Amphetamines, Ur Screen: NOT DETECTED
Barbiturates, Ur Screen: NOT DETECTED
Benzodiazepine, Ur Scrn: NOT DETECTED
Cannabinoid 50 Ng, Ur ~~LOC~~: NOT DETECTED
Cocaine Metabolite,Ur ~~LOC~~: NOT DETECTED
MDMA (Ecstasy)Ur Screen: NOT DETECTED
Methadone Scn, Ur: NOT DETECTED
Opiate, Ur Screen: NOT DETECTED
Phencyclidine (PCP) Ur S: NOT DETECTED
Tricyclic, Ur Screen: NOT DETECTED

## 2021-02-20 LAB — CBC WITH DIFFERENTIAL/PLATELET
Abs Immature Granulocytes: 0.01 10*3/uL (ref 0.00–0.07)
Basophils Absolute: 0 10*3/uL (ref 0.0–0.1)
Basophils Relative: 1 %
Eosinophils Absolute: 0.3 10*3/uL (ref 0.0–0.5)
Eosinophils Relative: 3 %
HCT: 40.1 % (ref 36.0–46.0)
Hemoglobin: 13.9 g/dL (ref 12.0–15.0)
Immature Granulocytes: 0 %
Lymphocytes Relative: 35 %
Lymphs Abs: 2.9 10*3/uL (ref 0.7–4.0)
MCH: 31 pg (ref 26.0–34.0)
MCHC: 34.7 g/dL (ref 30.0–36.0)
MCV: 89.5 fL (ref 80.0–100.0)
Monocytes Absolute: 0.7 10*3/uL (ref 0.1–1.0)
Monocytes Relative: 8 %
Neutro Abs: 4.3 10*3/uL (ref 1.7–7.7)
Neutrophils Relative %: 53 %
Platelets: 361 10*3/uL (ref 150–400)
RBC: 4.48 MIL/uL (ref 3.87–5.11)
RDW: 12.5 % (ref 11.5–15.5)
WBC: 8.1 10*3/uL (ref 4.0–10.5)
nRBC: 0 % (ref 0.0–0.2)

## 2021-02-20 LAB — COMPREHENSIVE METABOLIC PANEL
ALT: 66 U/L — ABNORMAL HIGH (ref 0–44)
AST: 53 U/L — ABNORMAL HIGH (ref 15–41)
Albumin: 4.2 g/dL (ref 3.5–5.0)
Alkaline Phosphatase: 54 U/L (ref 38–126)
Anion gap: 9 (ref 5–15)
BUN: 12 mg/dL (ref 6–20)
CO2: 23 mmol/L (ref 22–32)
Calcium: 8.9 mg/dL (ref 8.9–10.3)
Chloride: 102 mmol/L (ref 98–111)
Creatinine, Ser: 0.46 mg/dL (ref 0.44–1.00)
GFR, Estimated: >60 mL/min (ref >60)
Glucose, Bld: 99 mg/dL (ref 70–99)
Potassium: 3.6 mmol/L (ref 3.5–5.1)
Sodium: 134 mmol/L — ABNORMAL LOW (ref 135–145)
Total Bilirubin: 0.5 mg/dL (ref 0.3–1.2)
Total Protein: 7.6 g/dL (ref 6.5–8.1)

## 2021-02-20 LAB — POC URINE PREG, ED: Preg Test, Ur: NEGATIVE

## 2021-02-20 LAB — SEDIMENTATION RATE: Sed Rate: 18 mm/hr (ref 0–20)

## 2021-02-20 LAB — URINALYSIS, COMPLETE (UACMP) WITH MICROSCOPIC
Bilirubin Urine: NEGATIVE
Glucose, UA: NEGATIVE mg/dL
Hgb urine dipstick: NEGATIVE
Ketones, ur: 20 mg/dL — AB
Leukocytes,Ua: NEGATIVE
Nitrite: NEGATIVE
Protein, ur: NEGATIVE mg/dL
Specific Gravity, Urine: 1.006 (ref 1.005–1.030)
WBC, UA: NONE SEEN WBC/hpf (ref 0–5)
pH: 7 (ref 5.0–8.0)

## 2021-02-20 LAB — SALICYLATE LEVEL: Salicylate Lvl: <7 mg/dL — ABNORMAL LOW (ref 7.0–30.0)

## 2021-02-20 LAB — FERRITIN: Ferritin: 93 ng/mL (ref 11–307)

## 2021-02-20 LAB — CK: Total CK: 57 U/L (ref 38–234)

## 2021-02-20 LAB — D-DIMER, QUANTITATIVE: D-Dimer, Quant: <0.27 ug/mL-FEU (ref 0.00–0.50)

## 2021-02-20 LAB — RESP PANEL BY RT-PCR (FLU A&B, COVID) ARPGX2
Influenza A by PCR: NEGATIVE
Influenza B by PCR: NEGATIVE
SARS Coronavirus 2 by RT PCR: POSITIVE — AB

## 2021-02-20 LAB — ACETAMINOPHEN LEVEL: Acetaminophen (Tylenol), Serum: <10 ug/mL — ABNORMAL LOW (ref 10–30)

## 2021-02-20 MED ORDER — ENOXAPARIN SODIUM 40 MG/0.4ML IJ SOSY
40.0000 mg | PREFILLED_SYRINGE | INTRAMUSCULAR | Status: DC
  Administered 2021-02-21: 22:00:00 40 mg via SUBCUTANEOUS
  Filled 2021-02-20: qty 0.4

## 2021-02-20 MED ORDER — ONDANSETRON HCL 4 MG PO TABS: 4.0000 mg | ORAL_TABLET | Freq: Four times a day (QID) | ORAL | Status: DC | PRN

## 2021-02-20 MED ORDER — DIPHENHYDRAMINE HCL 50 MG/ML IJ SOLN
25.0000 mg | Freq: Once | INTRAMUSCULAR | Status: AC
  Administered 2021-02-20: 25 mg via INTRAVENOUS
  Filled 2021-02-20: qty 1

## 2021-02-20 MED ORDER — ACETAMINOPHEN 325 MG PO TABS
650.0000 mg | ORAL_TABLET | Freq: Four times a day (QID) | ORAL | Status: DC | PRN
  Administered 2021-02-21: 18:00:00 650 mg via ORAL
  Filled 2021-02-20: qty 2

## 2021-02-20 MED ORDER — LORAZEPAM 2 MG/ML IJ SOLN
0.5000 mg | Freq: Four times a day (QID) | INTRAMUSCULAR | Status: DC | PRN
  Administered 2021-02-21: 0.5 mg via INTRAVENOUS
  Filled 2021-02-20: qty 1

## 2021-02-20 MED ORDER — LORAZEPAM 2 MG/ML IJ SOLN
2.0000 mg | Freq: Once | INTRAMUSCULAR | Status: AC
  Administered 2021-02-20: 2 mg via INTRAVENOUS
  Filled 2021-02-20: qty 1

## 2021-02-20 MED ORDER — SODIUM CHLORIDE 0.9 % IV BOLUS: 1000.0000 mL | Freq: Once | INTRAVENOUS | Status: DC

## 2021-02-20 MED ORDER — DIPHENHYDRAMINE HCL 50 MG/ML IJ SOLN: 25.0000 mg | Freq: Once | INTRAMUSCULAR | Status: DC

## 2021-02-20 MED ORDER — LACTATED RINGERS IV BOLUS
1000.0000 mL | Freq: Once | INTRAVENOUS | Status: AC
  Administered 2021-02-20: 1000 mL via INTRAVENOUS

## 2021-02-20 MED ORDER — ACETAMINOPHEN 650 MG RE SUPP: 650.0000 mg | Freq: Four times a day (QID) | RECTAL | Status: DC | PRN

## 2021-02-20 MED ORDER — DROPERIDOL 2.5 MG/ML IJ SOLN: 2.5000 mg | Freq: Once | INTRAMUSCULAR | Status: DC

## 2021-02-20 MED ORDER — ONDANSETRON HCL 4 MG/2ML IJ SOLN: 4.0000 mg | Freq: Four times a day (QID) | INTRAMUSCULAR | Status: DC | PRN

## 2021-02-20 NOTE — ED Notes (Signed)
Purple and green sent to lab

## 2021-02-20 NOTE — ED Triage Notes (Addendum)
Pt tested positive on Monday for covid- pt friend states that she just started having difficulty walking, states goes towards the left- pt also having new onset stuttering- when pt takes a deep breath and slows down she can speak normally- pt also found out on Monday that her brother got killed- pt states that otherwise she feels okay

## 2021-02-20 NOTE — ED Notes (Signed)
Positive covid called from lab. cari beth triplett, np notified of positive result.

## 2021-02-20 NOTE — H&P (Signed)
History and Physical    Elaine Aguilar ERX:[id number] DOB: 1980/11/05 DOA: 02/20/2021  PCP: [facility]   Patient coming from: Home  I have personally briefly reviewed patient's old medical records in [facility]  Chief Complaint: Weakness, drifting when walking, covid positive  HPI: Elaine Aguilar is a 40 y.o. female with medical history significant for with no significant past medical history but who tested positive for COVID on 5/9 who was brought to the ED because of a several hour onset of sensation of drifting to the left when walking, stuttering, with intermittent slurred speech.  She had no headache, visual disturbance, one-sided numbness weakness or tingling.  Has generalized weakness.  Currently has a cough and has been using Robitussin.  Otherwise denies shortness of breath, fever or chills and denies chest pain. ED course: On arrival, afebrile, BP 130/75, pulse 68, O2 sat 98% on room air.  CBC and CMP significant only for mild transaminitis with AST/ALT 53/66.  Urinalysis with 20 ketones otherwise normal.  UDS clean.  Sed rate and total CK normal, salicylate and acetaminophen levels below detectable.  COVID PCR positive  EKG, personally viewed and interpreted normal sinus rhythm at 65 Imaging: CT head: No acute intracranial abnormality.  Paranasal sinus disease  Patient had a nonfocal neurologic exam in the emergency room but presented 'odd' behavior to ED provider and was treated with Benadryl and Ativan in the emergency room.  Hospitalist consulted for admission.  Review of Systems: As per HPI otherwise all other systems on review of systems negative.    History reviewed. No pertinent past medical history.  Past Surgical History:  Procedure Laterality Date  . CHOLECYSTECTOMY N/A 07/19/2018   Procedure: LAPAROSCOPIC CHOLECYSTECTOMY;  Surgeon: Sung Amabile, DO;  Location: [facility] ORS;  Service: General;  Laterality: N/A;     reports that she has never  smoked. She has never used smokeless tobacco. She reports previous alcohol use. No history on file for drug use.  Allergies  Allergen Reactions  . Ceftriaxone Hives  . Other     Unknown antibiotic  . Penicillin G Itching    History reviewed. No pertinent family history.    Prior to Admission medications   Medication Sig Start Date End Date Taking? Authorizing Provider  ibuprofen (ADVIL,MOTRIN) 600 MG tablet Take 1 tablet (600 mg total) by mouth every 6 (six) hours as needed (for mild pain not relieved by other medications.). 07/19/18   Sung Amabile, DO    Physical Exam: Vitals:   02/20/21 1815 02/20/21 1830 02/20/21 1900 02/20/21 1918  BP:  131/80  130/75  Pulse: 63 69  68  Resp:  11 10 18   Temp:      TempSrc:      SpO2: 100% 98%  98%  Weight:      Height:         Vitals:   02/20/21 1815 02/20/21 1830 02/20/21 1900 02/20/21 1918  BP:  131/80  130/75  Pulse: 63 69  68  Resp:  11 10 18   Temp:      TempSrc:      SpO2: 100% 98%  98%  Weight:      Height:          Constitutional: Alert and oriented x 3 . Not in any apparent distress. HEENT:      Head: Normocephalic and atraumatic.         Eyes: PERLA, EOMI, Conjunctivae are normal. Sclera is non-icteric.  Mouth/Throat: Mucous membranes are moist.       Neck: Supple with no signs of meningismus. Cardiovascular: Regular rate and rhythm. No murmurs, gallops, or rubs. 2+ symmetrical distal pulses are present . No JVD. No LE edema Respiratory: Respiratory effort normal .Lungs sounds clear bilaterally. No wheezes, crackles, or rhonchi.  Gastrointestinal: Soft, non tender, and non distended with positive bowel sounds.  Genitourinary: No CVA tenderness. Musculoskeletal: Nontender with normal range of motion in all extremities. No cyanosis, or erythema of extremities. Neurologic:  Face is symmetric. Moving all extremities. No gross focal neurologic deficits.  Patient stuttering when speaking which she states she has  never done.  No focal deficit Skin: Skin is warm, dry.  No rash or ulcers Psychiatric: Mood and affect are normal    Labs on Admission: I have personally reviewed following labs and imaging studies  CBC: Recent Labs  Lab 02/20/21 1726  WBC 8.1  NEUTROABS 4.3  HGB 13.9  HCT 40.1  MCV 89.5  PLT 361   Basic Metabolic Panel: Recent Labs  Lab 02/20/21 1726  NA 134*  K 3.6  CL 102  CO2 23  GLUCOSE 99  BUN 12  CREATININE 0.46  CALCIUM 8.9   GFR: Estimated Creatinine Clearance: 97.5 mL/min (by C-G formula based on SCr of 0.46 mg/dL). Liver Function Tests: Recent Labs  Lab 02/20/21 1726  AST 53*  ALT 66*  ALKPHOS 54  BILITOT 0.5  PROT 7.6  ALBUMIN 4.2   No results for input(s): LIPASE, AMYLASE in the last 168 hours. No results for input(s): AMMONIA in the last 168 hours. Coagulation Profile: No results for input(s): INR, PROTIME in the last 168 hours. Cardiac Enzymes: Recent Labs  Lab 02/20/21 1726  CKTOTAL 57   BNP (last 3 results) No results for input(s): PROBNP in the last 8760 hours. HbA1C: No results for input(s): HGBA1C in the last 72 hours. CBG: No results for input(s): GLUCAP in the last 168 hours. Lipid Profile: No results for input(s): CHOL, HDL, LDLCALC, TRIG, CHOLHDL, LDLDIRECT in the last 72 hours. Thyroid Function Tests: No results for input(s): TSH, T4TOTAL, FREET4, T3FREE, THYROIDAB in the last 72 hours. Anemia Panel: No results for input(s): VITAMINB12, FOLATE, FERRITIN, TIBC, IRON, RETICCTPCT in the last 72 hours. Urine analysis:    Component Value Date/Time   COLORURINE YELLOW (A) 02/20/2021 1726   APPEARANCEUR HAZY (A) 02/20/2021 1726   LABSPEC 1.006 02/20/2021 1726   PHURINE 7.0 02/20/2021 1726   GLUCOSEU NEGATIVE 02/20/2021 1726   HGBUR NEGATIVE 02/20/2021 1726   BILIRUBINUR NEGATIVE 02/20/2021 1726   KETONESUR 20 (A) 02/20/2021 1726   PROTEINUR NEGATIVE 02/20/2021 1726   NITRITE NEGATIVE 02/20/2021 1726   LEUKOCYTESUR  NEGATIVE 02/20/2021 1726    Radiological Exams on Admission: CT Head Wo Contrast  Result Date: 02/20/2021 CLINICAL DATA:  Altered mental status EXAM: CT HEAD WITHOUT CONTRAST TECHNIQUE: Contiguous axial images were obtained from the base of the skull through the vertex without intravenous contrast. COMPARISON:  None. FINDINGS: Brain: No evidence of acute infarction, hemorrhage, hydrocephalus, extra-axial collection or mass lesion/mass effect. Vascular: No hyperdense vessel or unexpected calcification. Skull: Normal. Negative for fracture or focal lesion. Sinuses/Orbits: Mucosal thickening of the frontal sinuses, sphenoid sinuses, right maxillary sinus and ethmoid air cells Other: Mastoid air cells are predominantly clear. IMPRESSION: 1. No acute intracranial abnormality. 2. Paranasal sinus disease. Electronically Signed   By: Maudry Mayhew MD   On: 02/20/2021 19:18     Assessment/Plan 40 year old female with no significant  past medical history but who tested positive for COVID on 5/9 presenting with sensation of drifting to the left when walking, stuttering, with intermittent slurred speech.      Acute metabolic encephalopathy ? covid related   Transient neurologic deficit - Patient with several hour onset of reports of drifting to the left when walking, stuttering, slurred speech.  Was uncooperative with neuro exam which was grossly nonfocal - CT head unremarkable, MRI inremarkable - Differential includes TIA, medication side effect( Patient was taking several homeopathic remedies but no steroids), as well as stress related given recent traumatic death of brother.  Lower suspicion for COVID-encephalopathy - Cardiac monitoring - Neurologic checks every 4 with fall precautions - Ativan as needed agitation (patient was treated with Benadryl and Ativan in the ER) - Consider neurology consult in the a.m. - If symptoms continue and no medical etiology, consider psych eval (patient lost her brother  in the past week)    COVID-19 virus infection - Was symptomatic for cough only.  COVID PCR remains positive - Follow chest x-ray, CRP and D-dimer - Tested positive on 5/9 - Airborne precaution.  No other treatment for now    Transaminitis - Mild double-digit elevation in AST-ALT - Can continue to monitor    DVT prophylaxis: Lovenox  Code Status: full code  Family Communication:  none  Disposition Plan: Back to previous home environment Consults called: none  Status: Observation    Andris Baumann MD [facility]     02/20/2021, 9:34 PM

## 2021-02-20 NOTE — ED Provider Notes (Signed)
[facility] Emergency Department Provider Note ____________________________________________   Event Date/Time   First MD Initiated Contact with Patient 02/20/21 1711     (approximate)  I have reviewed the triage vital signs and the nursing notes.  HISTORY  Chief Complaint No chief complaint on file.   HPI Elaine Aguilar is a 40 y.o. femalewho presents to the ED for evaluation of odd behavior.  Chart review indicates minimal medical history. Obesity.   Patient presents to the ED via POV with her lifelong friend for evaluation of odd behavior over the past few hours.  Friend provides majority of history.  Friend reports patient was feeling tired and weak over last weekend and had a home positive COVID swab this past Monday, 5 days ago.  They also report that on Monday patient found out that her brother was killed.   Friend reports taking the patient out for a walk today.  She seemed fine initially and was ambulating independently, although a little more slowly.  They did stop a couple times during this walk, but they did finish.  After the walk, friend reports that patient was speaking slowly and seemed to be slurring her words and some.  Friends husband is a Engineer, civil (consulting), he spoke with the patient, and advised they present to the ED to the possibility of stroke.  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  No recreational drugs, alcohol, smoking.  The patient does report she has been drinking Robitussin throughout the day today.  Reports only drinking 1 bottle today.  Reports that she does not know what is happening.  History is somewhat limited due to her apparent refusal to participate.  She denies pain and reports feeling tired and weak.  Reports she does not know why this is happening to her.  History reviewed. No pertinent past medical history.  Patient Active Problem List   Diagnosis Date Noted  . Acute cholecystitis  07/19/2018    Past Surgical History:  Procedure Laterality Date  . CHOLECYSTECTOMY N/A 07/19/2018   Procedure: LAPAROSCOPIC CHOLECYSTECTOMY;  Surgeon: Sung Amabile, DO;  Location: [facility] ORS;  Service: General;  Laterality: N/A;    Prior to Admission medications   Medication Sig Start Date End Date Taking? Authorizing Provider  ibuprofen (ADVIL,MOTRIN) 600 MG tablet Take 1 tablet (600 mg total) by mouth every 6 (six) hours as needed (for mild pain not relieved by other medications.). 07/19/18   Sung Amabile, DO    Allergies Ceftriaxone and Other  No family history on file.  Social History Social History   Tobacco Use  . Smoking status: Never Smoker  . Smokeless tobacco: Never Used  Substance Use Topics  . Alcohol use: Not Currently    Review of Systems  Constitutional: No fever/chills.  Positive for generalized weakness Eyes: No visual changes. ENT: No sore throat. Cardiovascular: Denies chest pain. Respiratory: Denies shortness of breath. Gastrointestinal: No abdominal pain.  No nausea, no vomiting.  No diarrhea.  No constipation. Genitourinary: Negative for dysuria. Musculoskeletal: Negative for back pain. Skin: Negative for rash. Neurological: Negative for headaches, focal weakness.  ____________________________________________   PHYSICAL EXAM:  VITAL SIGNS: Vitals:   02/20/21 1900 02/20/21 1918  BP:  130/75  Pulse:  68  Resp: 10 18  Temp:    SpO2:  98%     Constitutional: Alert and fully oriented.  Supine in bed and looks like she is melting back into the bed. Her speech cadence  is very odd and inconsistent throughout our conversation.  At 1 point, when her friend asks her a question, patient looks at her and speaks with a normal speech cadence without dysarthria or dysphagia. But when she is talking with me, she seems to flinch or jerk her head and face with every syllable.  Difficult to get her to participate and interact in the exam, but with  encouragement she does follow commands in all 4 extremities without apparent deficit or laterality. Eyes: Conjunctivae are normal. PERRL. EOMI. Head: Atraumatic. Nose: No congestion/rhinnorhea. Mouth/Throat: Mucous membranes are moist.  Oropharynx non-erythematous. Neck: No stridor. No cervical spine tenderness to palpation. Cardiovascular: Normal rate, regular rhythm. Grossly normal heart sounds.  Good peripheral circulation. Respiratory: Normal respiratory effort.  No retractions. Lungs CTAB. Gastrointestinal: Soft , nondistended, nontender to palpation. No CVA tenderness. Musculoskeletal: No lower extremity tenderness nor edema.  No joint effusions. No signs of acute trauma. Palpation of all 4 extremities without evidence of deformity, tenderness or signs of trauma. Neurologic:   No gross focal neurologic deficits are appreciated.  She initially refuses to sit up in the bed or lift her legs so I can assess strength. I have to lift each leg up into the air individually and encouraged her to actually use her muscles to keep it up in the ER, which she is able to bilaterally and symmetrically. Similarly, I have to pull her forward to get her back off of the stretcher to sit upright, but she is able to hold her self upright once she starts firing her muscles. This does not seem to be focused to either side or upper extremity versus lower extremity, seems diffuse throughout. Skin:  Skin is warm, dry and intact. No rash noted. Psychiatric: Mood and affect are flat and odd  ____________________________________________   LABS (all labs ordered are listed, but only abnormal results are displayed)  Labs Reviewed  RESP PANEL BY RT-PCR (FLU A&B, COVID) ARPGX2 - Abnormal; Notable for the following components:      Result Value   SARS Coronavirus 2 by RT PCR POSITIVE (*)    All other components within normal limits  URINALYSIS, COMPLETE (UACMP) WITH MICROSCOPIC - Abnormal; Notable for the following  components:   Color, Urine YELLOW (*)    APPearance HAZY (*)    Ketones, ur 20 (*)    Bacteria, UA RARE (*)    All other components within normal limits  COMPREHENSIVE METABOLIC PANEL - Abnormal; Notable for the following components:   Sodium 134 (*)    AST 53 (*)    ALT 66 (*)    All other components within normal limits  SALICYLATE LEVEL - Abnormal; Notable for the following components:   Salicylate Lvl <7.0 (*)    All other components within normal limits  ACETAMINOPHEN LEVEL - Abnormal; Notable for the following components:   Acetaminophen (Tylenol), Serum <10 (*)    All other components within normal limits  CBC WITH DIFFERENTIAL/PLATELET  URINE DRUG SCREEN, QUALITATIVE ([facility] ONLY)  SEDIMENTATION RATE  CK  POC URINE PREG, ED   ____________________________________________  12 Lead EKG  Sinus rhythm, rate of 65 bpm.  Normal axis and intervals.  No evidence of acute ischemia. ____________________________________________  RADIOLOGY  ED MD interpretation: CT head reviewed by me without evidence of acute intracranial pathology.  Official radiology report(s): CT Head Wo Contrast  Result Date: 02/20/2021 CLINICAL DATA:  Altered mental status EXAM: CT HEAD WITHOUT CONTRAST TECHNIQUE: Contiguous axial images were obtained from the  base of the skull through the vertex without intravenous contrast. COMPARISON:  None. FINDINGS: Brain: No evidence of acute infarction, hemorrhage, hydrocephalus, extra-axial collection or mass lesion/mass effect. Vascular: No hyperdense vessel or unexpected calcification. Skull: Normal. Negative for fracture or focal lesion. Sinuses/Orbits: Mucosal thickening of the frontal sinuses, sphenoid sinuses, right maxillary sinus and ethmoid air cells Other: Mastoid air cells are predominantly clear. IMPRESSION: 1. No acute intracranial abnormality. 2. Paranasal sinus disease. Electronically Signed   By: Maudry Mayhew MD   On: 02/20/2021 19:18     ____________________________________________   PROCEDURES and INTERVENTIONS  Procedure(s) performed (including Critical Care):  .1-3 Lead EKG Interpretation Performed by: Delton Prairie, MD Authorized by: Delton Prairie, MD     Interpretation: normal     ECG rate:  72   ECG rate assessment: normal     Rhythm: sinus rhythm     Ectopy: none     Conduction: normal      Medications  lactated ringers bolus 1,000 mL (1,000 mLs Intravenous New Bag/Given 02/20/21 1912)  LORazepam (ATIVAN) injection 2 mg (2 mg Intravenous Given 02/20/21 1912)  diphenhydrAMINE (BENADRYL) injection 25 mg (25 mg Intravenous Given 02/20/21 1913)    ____________________________________________   MDM / ED COURSE   Largely healthy 40 year old woman presents to the ED with very odd behavior in the setting of her brother recently being killed and recently being tested positive for COVID-19 at home.  Suspect either a grief or stress reaction versus accidental dextromethorphan overdose.  No signs of CVA, any focal neurologic deficits or laterality.  Doubt Guillain-Barr syndrome as her weakness has been diffuse throughout without superior rising and her symptoms are improving with Ativan and Benadryl.  Due to her continued confusion, gait instability and a degree of uncertainty, we will discuss the case with hospitalist medicine for admission, further work-up and management.  Clinical Course as of 02/20/21 2037  Sat Feb 20, 2021  2036 Reassessed.  Patient no longer twitching when she speaks.  Still quite unsteady on her feet going to the restroom.  I discussed observation medical admission and they are agreeable [DS]    Clinical Course User Index [DS] Delton Prairie, MD    ____________________________________________   FINAL CLINICAL IMPRESSION(S) / ED DIAGNOSES  Final diagnoses:  None     ED Discharge Orders    None       Juvon Teater   Note:  This document was prepared using Dragon voice  recognition software and may include unintentional dictation errors.   Delton Prairie, MD 02/20/21 2039

## 2021-02-21 ENCOUNTER — Encounter: Payer: Self-pay | Admitting: Internal Medicine

## 2021-02-21 DIAGNOSIS — F449 Dissociative and conversion disorder, unspecified: Secondary | ICD-10-CM

## 2021-02-21 DIAGNOSIS — F4321 Adjustment disorder with depressed mood: Secondary | ICD-10-CM

## 2021-02-21 DIAGNOSIS — F4320 Adjustment disorder, unspecified: Secondary | ICD-10-CM

## 2021-02-21 LAB — URINE DRUG SCREEN, QUALITATIVE (ARMC ONLY)
Amphetamines, Ur Screen: NOT DETECTED
Barbiturates, Ur Screen: NOT DETECTED
Benzodiazepine, Ur Scrn: NOT DETECTED
Cannabinoid 50 Ng, Ur ~~LOC~~: NOT DETECTED
Cocaine Metabolite,Ur ~~LOC~~: NOT DETECTED
MDMA (Ecstasy)Ur Screen: NOT DETECTED
Methadone Scn, Ur: NOT DETECTED
Opiate, Ur Screen: NOT DETECTED
Phencyclidine (PCP) Ur S: NOT DETECTED
Tricyclic, Ur Screen: NOT DETECTED

## 2021-02-21 LAB — C-REACTIVE PROTEIN: CRP: 0.6 mg/dL (ref <1.0)

## 2021-02-21 LAB — HIV ANTIBODY (ROUTINE TESTING W REFLEX): HIV Screen 4th Generation wRfx: NONREACTIVE

## 2021-02-21 LAB — PROCALCITONIN: Procalcitonin: <0.1 ng/mL

## 2021-02-21 NOTE — Progress Notes (Signed)
Inpatient Rehab Admissions Coordinator Note:   Per PT recommendation, pt was screened for CIR candidacy by Wolfgang Phoenix, MS, CCC-SLP.  At this time we are not recommending an inpatient rehab consult. Note pt is under observation status at this time. Pt may not have the medical necessity to warrant an inpatient rehab stay if they remain observation. Also note pt COVID + 02/15/21. Patients are eligible to be considered for admit to CIR when cleared from airborne precautions by acute MD or Infectious Disease.  Otherwise they will need to be >20 days from their positive test with recovery/improvement in symptoms or 2 negative tests. Please contact me with questions.    Wolfgang Phoenix, MS, CCC-SLP Admissions Coordinator [phone] 02/21/21 4:34 PM

## 2021-02-21 NOTE — Progress Notes (Addendum)
Patient discharging and needs outpatient therapy resources. List provided for RN to give to patient due to isolation status.  3:50- PT update, not safe to DC home and recommending CIR. TOC will follow for CIR assessment.  Alfonso Ramus, [place] [phone]

## 2021-02-21 NOTE — Consult Note (Signed)
NEUROLOGY CONSULTATION NOTE   Date of service: Feb 21, 2021 Patient Name: Elaine Aguilar MRN:  [id number] DOB:  1981-02-20 Reason for consult: stuttering and abnormal behavior _ _ _   _ __   _ __ _ _  __ __   _ __   __ _  History of Present Illness   Elaine Aguilar a 40 y.o.femalewith medical history significant forwith no significant past medical history but who tested positive for COVID on 5/9 who was brought to the ED because of a several hour onset of sensation of drifting to the left when walking, stuttering, with intermittent slurred speech. On arrival to ED had no headache, visual disturbance, one-sided numbness weakness or tingling but did report generalized weakness. In ED she had multiple episodes of exaggerated purposeful blinking as well as stuttering when being examined but not consistently. She denied weakness to me this afternoon but when examined was surprised to find that she couldn't lift her RLE (she was finally able to with coaching). She sadly lost her brother earlier this week and was unable to travel to [place] for the funeral. This has been very hard on her. She wonders if the sx could be related to stress.  MRI brain wo contrast WNL   ROS   10 point review of systems was performed and was negative except as described in HPI.  Past History   History reviewed. No pertinent past medical history. Past Surgical History:  Procedure Laterality Date  . CHOLECYSTECTOMY N/A 07/19/2018   Procedure: LAPAROSCOPIC CHOLECYSTECTOMY;  Surgeon: Sung Amabile, DO;  Location: [facility] ORS;  Service: General;  Laterality: N/A;   History reviewed. No pertinent family history. Social History   Socioeconomic History  . Marital status: Single    Spouse name: Not on file  . Number of children: Not on file  . Years of education: Not on file  . Highest education level: Not on file  Occupational History  . Not on file  Tobacco Use  . Smoking status: Never Smoker  . Smokeless tobacco:  Never Used  Substance and Sexual Activity  . Alcohol use: Not Currently  . Drug use: Not on file  . Sexual activity: Not on file  Other Topics Concern  . Not on file  Social History Narrative  . Not on file   Social Determinants of Health   Financial Resource Strain: Not on file  Food Insecurity: Not on file  Transportation Needs: Not on file  Physical Activity: Not on file  Stress: Not on file  Social Connections: Not on file   Allergies  Allergen Reactions  . Ceftriaxone Hives  . Other     Unknown antibiotic  . Penicillin G Itching    Medications   Medications Prior to Admission  Medication Sig Dispense Refill Last Dose  . ibuprofen (ADVIL,MOTRIN) 600 MG tablet Take 1 tablet (600 mg total) by mouth every 6 (six) hours as needed (for mild pain not relieved by other medications.). (Patient not taking: Reported on 02/20/2021) 30 tablet 0 Completed Course at Unknown time     Vitals   Vitals:   02/21/21 0426 02/21/21 0745 02/21/21 1208 02/21/21 1715  BP: 106/67 116/68 111/76 122/82  Pulse: (!) 52 62 60 64  Resp: 16 18 16 18   Temp: 98.2 F (36.8 C) 97.7 F (36.5 C) 98.6 F (37 C) 97.9 F (36.6 C)  TempSrc: Oral Oral    SpO2: 96% 96% 99% 96%  Weight:      Height:  Body mass index is 33.83 kg/m.  Physical Exam   Physical Exam Gen: A&O x4, NAD HEENT: Atraumatic, normocephalic;mucous membranes moist; oropharynx clear, tongue without atrophy or fasciculations. Neck: Supple, trachea midline. Resp: CTAB, no w/r/r CV: RRR, no m/g/r; nml S1 and S2. 2+ symmetric peripheral pulses. Abd: soft/NT/ND; nabs x 4 quad Extrem: Nml bulk; no cyanosis, clubbing, or edema.  Neuro: *MS: A&O x4. Follows multi-step commands.  *Speech: initially nondysarthric, no aphasia, normal speech pattern. When I began the physical exam she began halting speech that became worse as she discussed her brother's recent death. Speech pattern is distractible *CN:    I: Deferred    II,III: PERRLA, VFF by confrontation, optic discs sharp   III,IV,VI: EOMI w/o nystagmus, no ptosis   V: Sensation intact from V1 to V3 to LT   VII: Eyelid closure was full.  Smile symmetric.   VIII: Hearing intact to voice   IX,X: Voice normal, palate elevates symmetrically    XI: SCM/trap 5/5 bilat   XII: Tongue protrudes midline, no atrophy or fasciculations  *Motor:   Normal bulk.  No tremor, rigidity or bradykinesia. Initially BUE only some effort against gravity, but 5/5 throughout with coaching. RLE 5/5. Initially no movement RLE, at least 4/5 with coaching, still effort dependent. *Sensory: SILT *Coordination:  FNF intact bilat *Reflexes:  2+ and symmetric throughout without clonus; toes down-going bilat *Gait: deferred   Labs   CBC:  Recent Labs  Lab 02/20/21 1726  WBC 8.1  NEUTROABS 4.3  HGB 13.9  HCT 40.1  MCV 89.5  PLT 361    Basic Metabolic Panel:  Lab Results  Component Value Date   NA 134 (L) 02/20/2021   K 3.6 02/20/2021   CO2 23 02/20/2021   GLUCOSE 99 02/20/2021   BUN 12 02/20/2021   CREATININE 0.46 02/20/2021   CALCIUM 8.9 02/20/2021   GFRNONAA >60 02/20/2021   GFRAA >60 07/19/2018   Lipid Panel: No results found for: LDLCALC HgbA1c: No results found for: HGBA1C Urine Drug Screen:     Component Value Date/Time   LABOPIA NONE DETECTED 02/21/2021 1420   COCAINSCRNUR NONE DETECTED 02/21/2021 1420   LABBENZ NONE DETECTED 02/21/2021 1420   AMPHETMU NONE DETECTED 02/21/2021 1420   THCU NONE DETECTED 02/21/2021 1420   LABBARB NONE DETECTED 02/21/2021 1420    Alcohol Level No results found for: ETH   Impression   40 yo woman p/w multiple inconsistent complaints (different reports to different providers) including generalized weakness, R focal weakness, patchy paresthesias, slurring her words, disorientation, stuttering, and leaning to left while walking. Stuttering is distractible and strength improves with coaching. Exam is highly functional.  Normal MRI brain is reassuring. I explained to her that I thought that her sx were secondary to stress and grief reaction. We discused the dx of conversion disorder, which she said made sense to her. I explained that the best treatment is cognitive behavioral therapy and she was amenable to this after discharge.   Recommendations   - No further neurologic w/u indicated - Referral to CBT therapist after discharge  Neurology to sign off, but please re-engage if additional questions arise. ______________________________________________________________________   Thank you for the opportunity to take part in the care of this patient. If you have any further questions, please contact the neurology consultation attending.  Signed,  Bing Neighbors, MD [facility] [phone]  If 7pm- 7am, please page neurology on call as listed in AMION.

## 2021-02-21 NOTE — Progress Notes (Signed)
Assisted patient to bathroom this am, patient weak, noticeable spastic non-purposeful movement of head and eye lids (excessive blinking), expressive aphasia with a stutter. Weak lower extremities, with non-purposeful, jerky movements, with a tendancy to veer left.

## 2021-02-21 NOTE — Progress Notes (Addendum)
PROGRESS NOTE    Elaine Aguilar  OZH:[id number] DOB: 26-Oct-1980 DOA: 02/20/2021 PCP: [facility]    Brief Narrative:  Elaine ArisBlanca Erlich is a 40 y.o. female with medical history significant for with no significant past medical history but who tested positive for COVID on 5/9 who was brought to the ED because of a several hour onset of sensation of drifting to the left when walking, stuttering, with intermittent slurred speech.  She had no headache, visual disturbance, one-sided numbness weakness or tingling.  Has generalized weakness.  Currently has a cough and has been using Robitussin.  Otherwise denies shortness of breath, fever or chills and denies chest pain. ED course: On arrival, afebrile, BP 130/75, pulse 68, O2 sat 98% on room air.  CBC and CMP significant only for mild transaminitis with AST/ALT 53/66.  Urinalysis with 20 ketones otherwise normal.  UDS clean.  Sed rate and total CK normal, salicylate and acetaminophen levels below detectable.  COVID PCR positive   CT head and MRI without acute abnormality   Addendum: PT evaluated pt and feel not safe to dc , rec. CIR  Consultants:     Procedures: CT head and MRI brain  Antimicrobials:       Subjective: Went to bathroom, felt was leaning to right side. Has purposefull blinking and stuttering when talking, but her speech is understandable. No sob, cp. Denies cough for me this am  Objective: Vitals:   02/20/21 1900 02/20/21 1918 02/20/21 2236 02/21/21 0426  BP:  130/75 (!) 134/95 106/67  Pulse:  68 61 (!) 52  Resp: 10 18 16 16   Temp:   97.7 F (36.5 C) 98.2 F (36.8 C)  TempSrc:   Oral Oral  SpO2:  98% 100% 96%  Weight:      Height:        Intake/Output Summary (Last 24 hours) at 02/21/2021 0805 Last data filed at 02/20/2021 2041 Gross per 24 hour  Intake 1000 ml  Output --  Net 1000 ml   Filed Weights   02/20/21 1700  Weight: 86.6 kg    Examination:  General exam: Appears calm and comfortable   Respiratory system: Clear to auscultation. Respiratory effort normal. Cardiovascular system: S1 & S2 heard, RRR. No JVD, murmurs, rubs, gallops or clicks.  Gastrointestinal system: Abdomen is nondistended, soft and nontender.  Normal bowel sounds heard. Central nervous system: Alert and oriented. Stuttering with purposeful jerkey movement  And blinking Extremities:no edema Skin: Warm dry Psychiatry:  Mood & affect appropriate appropriate in current setting.     Data Reviewed: I have personally reviewed following labs and imaging studies  CBC: Recent Labs  Lab 02/20/21 1726  WBC 8.1  NEUTROABS 4.3  HGB 13.9  HCT 40.1  MCV 89.5  PLT 361   Basic Metabolic Panel: Recent Labs  Lab 02/20/21 1726  NA 134*  K 3.6  CL 102  CO2 23  GLUCOSE 99  BUN 12  CREATININE 0.46  CALCIUM 8.9   GFR: Estimated Creatinine Clearance: 97.5 mL/min (by C-G formula based on SCr of 0.46 mg/dL). Liver Function Tests: Recent Labs  Lab 02/20/21 1726  AST 53*  ALT 66*  ALKPHOS 54  BILITOT 0.5  PROT 7.6  ALBUMIN 4.2   No results for input(s): LIPASE, AMYLASE in the last 168 hours. No results for input(s): AMMONIA in the last 168 hours. Coagulation Profile: No results for input(s): INR, PROTIME in the last 168 hours. Cardiac Enzymes: Recent Labs  Lab 02/20/21 1726  CKTOTAL 57   BNP (last 3 results) No results for input(s): PROBNP in the last 8760 hours. HbA1C: No results for input(s): HGBA1C in the last 72 hours. CBG: No results for input(s): GLUCAP in the last 168 hours. Lipid Profile: No results for input(s): CHOL, HDL, LDLCALC, TRIG, CHOLHDL, LDLDIRECT in the last 72 hours. Thyroid Function Tests: No results for input(s): TSH, T4TOTAL, FREET4, T3FREE, THYROIDAB in the last 72 hours. Anemia Panel: Recent Labs    02/20/21 2239  FERRITIN 93   Sepsis Labs: Recent Labs  Lab 02/20/21 2239  PROCALCITON <0.10    Recent Results (from the past 240 hour(s))  Resp Panel by  RT-PCR (Flu A&B, Covid) Nasopharyngeal Swab     Status: Abnormal   Collection Time: 02/20/21  6:17 PM   Specimen: Nasopharyngeal Swab; Nasopharyngeal(NP) swabs in vial transport medium  Result Value Ref Range Status   SARS Coronavirus 2 by RT PCR POSITIVE (A) NEGATIVE Final    Comment: RESULT CALLED TO, READ BACK BY AND VERIFIED WITH: APRIL BRUMGARD ON 02/20/21 AT 1926 QSD (NOTE) SARS-CoV-2 target nucleic acids are DETECTED.  The SARS-CoV-2 RNA is generally detectable in upper respiratory specimens during the acute phase of infection. Positive results are indicative of the presence of the identified virus, but do not rule out bacterial infection or co-infection with other pathogens not detected by the test. Clinical correlation with patient history and other diagnostic information is necessary to determine patient infection status. The expected result is Negative.  Fact Sheet for Patients: BloggerCourse.com  Fact Sheet for Healthcare Providers: SeriousBroker.it  This test is not yet approved or cleared by the [place] FDA and  has been authorized for detection and/or diagnosis of SARS-CoV-2 by FDA under an Emergency Use Authorization (EUA).  This EUA will remain in effect (meaning this test ca n be used) for the duration of  the COVID-19 declaration under Section 564(b)(1) of the Act, 21 U.S.C. section 360bbb-3(b)(1), unless the authorization is terminated or revoked sooner.     Influenza A by PCR NEGATIVE NEGATIVE Final   Influenza B by PCR NEGATIVE NEGATIVE Final    Comment: (NOTE) The Xpert Xpress SARS-CoV-2/FLU/RSV plus assay is intended as an aid in the diagnosis of influenza from Nasopharyngeal swab specimens and should not be used as a sole basis for treatment. Nasal washings and aspirates are unacceptable for Xpert Xpress SARS-CoV-2/FLU/RSV testing.  Fact Sheet for  Patients: BloggerCourse.com  Fact Sheet for Healthcare Providers: SeriousBroker.it  This test is not yet approved or cleared by the [place] FDA and has been authorized for detection and/or diagnosis of SARS-CoV-2 by FDA under an Emergency Use Authorization (EUA). This EUA will remain in effect (meaning this test can be used) for the duration of the COVID-19 declaration under Section 564(b)(1) of the Act, 21 U.S.C. section 360bbb-3(b)(1), unless the authorization is terminated or revoked.  Performed at [facility], [address] [place], [place] [address]          Radiology Studies: CT Head Wo Contrast  Result Date: 02/20/2021 CLINICAL DATA:  Altered mental status EXAM: CT HEAD WITHOUT CONTRAST TECHNIQUE: Contiguous axial images were obtained from the base of the skull through the vertex without intravenous contrast. COMPARISON:  None. FINDINGS: Brain: No evidence of acute infarction, hemorrhage, hydrocephalus, extra-axial collection or mass lesion/mass effect. Vascular: No hyperdense vessel or unexpected calcification. Skull: Normal. Negative for fracture or focal lesion. Sinuses/Orbits: Mucosal thickening of the frontal sinuses, sphenoid sinuses, right maxillary sinus and ethmoid air  cells Other: Mastoid air cells are predominantly clear. IMPRESSION: 1. No acute intracranial abnormality. 2. Paranasal sinus disease. Electronically Signed   By: Maudry Mayhew MD   On: 02/20/2021 19:18   MR BRAIN WO CONTRAST  Result Date: 02/21/2021 CLINICAL DATA:  Initial evaluation for acute TIA. EXAM: MRI HEAD WITHOUT CONTRAST TECHNIQUE: Multiplanar, multiecho pulse sequences of the brain and surrounding structures were obtained without intravenous contrast. COMPARISON:  Prior head CT from earlier the same day. FINDINGS: Brain: Cerebral volume within normal limits for patient age. No focal parenchymal signal abnormality identified. No  abnormal foci of restricted diffusion to suggest acute or subacute ischemia. Gray-white matter differentiation well maintained. No encephalomalacia to suggest chronic infarction. No foci of susceptibility artifact to suggest acute or chronic intracranial hemorrhage. No mass lesion, midline shift or mass effect. No hydrocephalus. No extra-axial fluid collection. Major dural sinuses are grossly patent. Pituitary gland and suprasellar region are normal. Midline structures intact and normal. Vascular: Major intracranial vascular flow voids well maintained and normal in appearance. Skull and upper cervical spine: Craniocervical junction normal. Visualized upper cervical spine within normal limits. Bone marrow signal intensity normal. No scalp soft tissue abnormality. Sinuses/Orbits: Globes and orbital soft tissues within normal limits. Scattered mucosal thickening seen throughout the paranasal sinuses, with small air-fluid level within the left sphenoid sinus. No mastoid effusion. Inner ear structures grossly normal. Other: None. IMPRESSION: 1. Normal brain MRI. No acute intracranial abnormality identified. 2. Paranasal sinus disease as above. Electronically Signed   By: Rise Mu M.D.   On: 02/21/2021 00:14   DG Chest Port 1 View  Result Date: 02/20/2021 CLINICAL DATA:  Cough.  COVID positive earlier this week. EXAM: PORTABLE CHEST 1 VIEW COMPARISON:  None. FINDINGS: The cardiomediastinal contours are normal. The lungs are clear. Pulmonary vasculature is normal. No consolidation, pleural effusion, or pneumothorax. No acute osseous abnormalities are seen. IMPRESSION: No acute chest findings. Electronically Signed   By: Narda Rutherford M.D.   On: 02/20/2021 21:56        Scheduled Meds: . enoxaparin (LOVENOX) injection  40 mg Subcutaneous Q24H   Continuous Infusions:  Assessment & Plan:   Principal Problem:   Acute metabolic encephalopathy Active Problems:   COVID-19 virus infection    Transaminitis   Transient neurologic deficit  40 year old female with no significant past medical history but who tested positive for COVID on 5/9 presenting with sensation of drifting to the left when walking, stuttering, with intermittent slurred speech.       Transient neurologic deficit- etiology unclear. covid related? - Patient with several hour onset of reports of drifting to the left when walking, stuttering, slurred speech.  Was uncooperative with neuro exam which was grossly nonfocal - CT and MRI of the brain without acute abnormalities Neurochecks every 4 hours fall precautions Check urine toxicology Neurology consulted, will f/u on input Per H/P pt lost her brother in the past week. May need psych outpt consult if w/u negative      COVID-19 virus infection - Was symptomatic for cough only.  COVID PCR remains positive -  Chest x-ray negative  CRP, ferritin and D-dimers normal  O2 sats stable on room air No treatment at this time Airborne precautions      Transaminitis - Mild double-digit elevation in AST-ALT 5/15-will ck and monitor  DVT prophylaxis: Lovenox Code Status: Full Family Communication: None at bedside   Status is: Observation  The patient remains OBS appropriate and will d/c before 2 midnights.  Dispo:  The patient is from: Home              Anticipated d/c is to: Home              Patient currently is not medically stable to d/c.   Difficult to place patient No    Anticipated dc: neurology consulted, depending on their recommendation        LOS: 0 days   Time spent: 35 minutes with more than 50% on COC    Lynn Ito, MD [facility] Pager [phone]  If 7PM-7AM, please contact night-coverage 02/21/2021, 8:05 AM

## 2021-02-21 NOTE — Discharge Summary (Addendum)
Elaine Aguilar FTD:[id number] DOB: 1981-08-16 DOA: 02/20/2021  PCP: [facility]  Admit date: 02/20/2021 Discharge date: 02/21/2021  Admitted From: Home Disposition: Home  Recommendations for Outpatient Follow-up:  1. Follow up with PCP in 1 week 2. Please obtain BMP/CBC in one week 3. Follow-up with counselor next week list will be given by case manager  Home health : yes  Discharge Condition:Stable CODE STATUS: Full Diet recommendation: Regular   Brief/Interim Summary: Elaine Diazis a 40 y.o.femalewith medical history significant forwith no significant past medical history but who tested positive for COVID on 5/9 who was brought to the ED because of a several hour onset of sensation of drifting to the left when walking, stuttering, with intermittent slurred speech. She had no headache, visual disturbance, one-sided numbness weakness or tingling. Has generalized weakness. Currently has a cough and has been using Robitussin. Otherwise denies shortness of breath, fever or chills and denies chest pain.CBC and CMP significant only for mild transaminitis with AST/ALT 53/66. Urinalysis with 20 ketones otherwise normal. UDS clean. Sed rate and total CK normal, salicylate and acetaminophen levels below detectable. COVID PCR positive . CT head and MRI without acute abnormality  Transient neurologic deficit- 2/2 to functional from stress/grief CT and MRI of the brain without acute abnormalities Spoke to neurology they felt this was due to stressors as patient's brother recently passed away [facility] for discharge Will need to f/u with counselor as outpatient     COVID-19 virus infection-first positive on 5/9 Was symptomatic for cough only. COVID PCR remains positive Chest x-ray negative  CRP, ferritin and D-dimers normal  O2 sats stable on room air No treatment at this time Need to remain quarantined for total of 10 days     Transaminitis -Mild  elevation F/u with pcp as outpt  Addendum: pt was evaluated by CIR but declined. She feels she is able to move her legs better today. Her stuttering has improved more too . She told me has family near by that can help her or stay with. Ordered HH for her . No sob, cp, dizzness    Discharge Diagnoses:  Principal Problem:   Acute metabolic encephalopathy Active Problems:   COVID-19 virus infection   Transaminitis   Transient neurologic deficit    Discharge Instructions  Discharge Instructions    Diet - low sodium heart healthy   Complete by: As directed    Discharge instructions   Complete by: As directed    Need to f./u with pcp next week and counselor   Increase activity slowly   Complete by: As directed      Allergies as of 02/21/2021      Reactions   Ceftriaxone Hives   Other    Unknown antibiotic   Penicillin G Itching      Medication List    STOP taking these medications   ibuprofen 600 MG tablet Commonly known as: ADVIL       Follow-up Information    [facility] Follow up in 1 week(s).   Specialty: General Practice Contact information: [address] [place] [address] [phone]              Allergies  Allergen Reactions  . Ceftriaxone Hives  . Other     Unknown antibiotic  . Penicillin G Itching    Consultations:  Neurology   Procedures/Studies: CT Head Wo Contrast  Result Date: 02/20/2021 CLINICAL DATA:  Altered mental status EXAM: CT HEAD WITHOUT CONTRAST TECHNIQUE: Contiguous  axial images were obtained from the base of the skull through the vertex without intravenous contrast. COMPARISON:  None. FINDINGS: Brain: No evidence of acute infarction, hemorrhage, hydrocephalus, extra-axial collection or mass lesion/mass effect. Vascular: No hyperdense vessel or unexpected calcification. Skull: Normal. Negative for fracture or focal lesion. Sinuses/Orbits: Mucosal thickening of the frontal sinuses, sphenoid  sinuses, right maxillary sinus and ethmoid air cells Other: Mastoid air cells are predominantly clear. IMPRESSION: 1. No acute intracranial abnormality. 2. Paranasal sinus disease. Electronically Signed   By: Maudry Mayhew MD   On: 02/20/2021 19:18   MR BRAIN WO CONTRAST  Result Date: 02/21/2021 CLINICAL DATA:  Initial evaluation for acute TIA. EXAM: MRI HEAD WITHOUT CONTRAST TECHNIQUE: Multiplanar, multiecho pulse sequences of the brain and surrounding structures were obtained without intravenous contrast. COMPARISON:  Prior head CT from earlier the same day. FINDINGS: Brain: Cerebral volume within normal limits for patient age. No focal parenchymal signal abnormality identified. No abnormal foci of restricted diffusion to suggest acute or subacute ischemia. Gray-white matter differentiation well maintained. No encephalomalacia to suggest chronic infarction. No foci of susceptibility artifact to suggest acute or chronic intracranial hemorrhage. No mass lesion, midline shift or mass effect. No hydrocephalus. No extra-axial fluid collection. Major dural sinuses are grossly patent. Pituitary gland and suprasellar region are normal. Midline structures intact and normal. Vascular: Major intracranial vascular flow voids well maintained and normal in appearance. Skull and upper cervical spine: Craniocervical junction normal. Visualized upper cervical spine within normal limits. Bone marrow signal intensity normal. No scalp soft tissue abnormality. Sinuses/Orbits: Globes and orbital soft tissues within normal limits. Scattered mucosal thickening seen throughout the paranasal sinuses, with small air-fluid level within the left sphenoid sinus. No mastoid effusion. Inner ear structures grossly normal. Other: None. IMPRESSION: 1. Normal brain MRI. No acute intracranial abnormality identified. 2. Paranasal sinus disease as above. Electronically Signed   By: Rise Mu M.D.   On: 02/21/2021 00:14   DG Chest Port  1 View  Result Date: 02/20/2021 CLINICAL DATA:  Cough.  COVID positive earlier this week. EXAM: PORTABLE CHEST 1 VIEW COMPARISON:  None. FINDINGS: The cardiomediastinal contours are normal. The lungs are clear. Pulmonary vasculature is normal. No consolidation, pleural effusion, or pneumothorax. No acute osseous abnormalities are seen. IMPRESSION: No acute chest findings. Electronically Signed   By: Narda Rutherford M.D.   On: 02/20/2021 21:56      Subjective: No shortness of breath, cough this a.m., no chest pain  Discharge Exam: Vitals:   02/21/21 0745 02/21/21 1208  BP: 116/68 111/76  Pulse: 62 60  Resp: 18 16  Temp: 97.7 F (36.5 C) 98.6 F (37 C)  SpO2: 96% 99%   Vitals:   02/20/21 2236 02/21/21 0426 02/21/21 0745 02/21/21 1208  BP: (!) 134/95 106/67 116/68 111/76  Pulse: 61 (!) 52 62 60  Resp: 16 16 18 16   Temp: 97.7 F (36.5 C) 98.2 F (36.8 C) 97.7 F (36.5 C) 98.6 F (37 C)  TempSrc: Oral Oral Oral   SpO2: 100% 96% 96% 99%  Weight:      Height:        General: Pt is alert, awake, not in acute distress Cardiovascular: RRR, S1/S2 +, no rubs, no gallops Respiratory: CTA bilaterally, no wheezing, no rhonchi Abdominal: Soft, NT, ND, bowel sounds + Extremities: no edema    The results of significant diagnostics from this hospitalization (including imaging, microbiology, ancillary and laboratory) are listed below for reference.     Microbiology: Recent Results (  from the past 240 hour(s))  Resp Panel by RT-PCR (Flu A&B, Covid) Nasopharyngeal Swab     Status: Abnormal   Collection Time: 02/20/21  6:17 PM   Specimen: Nasopharyngeal Swab; Nasopharyngeal(NP) swabs in vial transport medium  Result Value Ref Range Status   SARS Coronavirus 2 by RT PCR POSITIVE (A) NEGATIVE Final    Comment: RESULT CALLED TO, READ BACK BY AND VERIFIED WITH: APRIL BRUMGARD ON 02/20/21 AT 1926 QSD (NOTE) SARS-CoV-2 target nucleic acids are DETECTED.  The SARS-CoV-2 RNA is generally  detectable in upper respiratory specimens during the acute phase of infection. Positive results are indicative of the presence of the identified virus, but do not rule out bacterial infection or co-infection with other pathogens not detected by the test. Clinical correlation with patient history and other diagnostic information is necessary to determine patient infection status. The expected result is Negative.  Fact Sheet for Patients: BloggerCourse.comhttps://www.fda.gov/media/152166/download  Fact Sheet for Healthcare Providers: SeriousBroker.ithttps://www.fda.gov/media/152162/download  This test is not yet approved or cleared by the [place] FDA and  has been authorized for detection and/or diagnosis of SARS-CoV-2 by FDA under an Emergency Use Authorization (EUA).  This EUA will remain in effect (meaning this test ca n be used) for the duration of  the COVID-19 declaration under Section 564(b)(1) of the Act, 21 U.S.C. section 360bbb-3(b)(1), unless the authorization is terminated or revoked sooner.     Influenza A by PCR NEGATIVE NEGATIVE Final   Influenza B by PCR NEGATIVE NEGATIVE Final    Comment: (NOTE) The Xpert Xpress SARS-CoV-2/FLU/RSV plus assay is intended as an aid in the diagnosis of influenza from Nasopharyngeal swab specimens and should not be used as a sole basis for treatment. Nasal washings and aspirates are unacceptable for Xpert Xpress SARS-CoV-2/FLU/RSV testing.  Fact Sheet for Patients: BloggerCourse.comhttps://www.fda.gov/media/152166/download  Fact Sheet for Healthcare Providers: SeriousBroker.ithttps://www.fda.gov/media/152162/download  This test is not yet approved or cleared by the [place] FDA and has been authorized for detection and/or diagnosis of SARS-CoV-2 by FDA under an Emergency Use Authorization (EUA). This EUA will remain in effect (meaning this test can be used) for the duration of the COVID-19 declaration under Section 564(b)(1) of the Act, 21 U.S.C. section 360bbb-3(b)(1), unless the  authorization is terminated or revoked.  Performed at [facility], [address] [place], [place] [id number]      Labs: BNP (last 3 results) No results for input(s): BNP in the last 8760 hours. Basic Metabolic Panel: Recent Labs  Lab 02/20/21 1726  NA 134*  K 3.6  CL 102  CO2 23  GLUCOSE 99  BUN 12  CREATININE 0.46  CALCIUM 8.9   Liver Function Tests: Recent Labs  Lab 02/20/21 1726  AST 53*  ALT 66*  ALKPHOS 54  BILITOT 0.5  PROT 7.6  ALBUMIN 4.2   No results for input(s): LIPASE, AMYLASE in the last 168 hours. No results for input(s): AMMONIA in the last 168 hours. CBC: Recent Labs  Lab 02/20/21 1726  WBC 8.1  NEUTROABS 4.3  HGB 13.9  HCT 40.1  MCV 89.5  PLT 361   Cardiac Enzymes: Recent Labs  Lab 02/20/21 1726  CKTOTAL 57   BNP: Invalid input(s): POCBNP CBG: No results for input(s): GLUCAP in the last 168 hours. D-Dimer Recent Labs    02/20/21 2239  DDIMER <0.27   Hgb A1c No results for input(s): HGBA1C in the last 72 hours. Lipid Profile No results for input(s): CHOL, HDL, LDLCALC, TRIG, CHOLHDL, LDLDIRECT in the last  72 hours. Thyroid function studies No results for input(s): TSH, T4TOTAL, T3FREE, THYROIDAB in the last 72 hours.  Invalid input(s): FREET3 Anemia work up Recent Labs    02/20/21 2239  FERRITIN 93   Urinalysis    Component Value Date/Time   COLORURINE YELLOW (A) 02/20/2021 1726   APPEARANCEUR HAZY (A) 02/20/2021 1726   LABSPEC 1.006 02/20/2021 1726   PHURINE 7.0 02/20/2021 1726   GLUCOSEU NEGATIVE 02/20/2021 1726   HGBUR NEGATIVE 02/20/2021 1726   BILIRUBINUR NEGATIVE 02/20/2021 1726   KETONESUR 20 (A) 02/20/2021 1726   PROTEINUR NEGATIVE 02/20/2021 1726   NITRITE NEGATIVE 02/20/2021 1726   LEUKOCYTESUR NEGATIVE 02/20/2021 1726   Sepsis Labs Invalid input(s): PROCALCITONIN,  WBC,  LACTICIDVEN Microbiology Recent Results (from the past 240 hour(s))  Resp Panel by RT-PCR (Flu A&B, Covid)  Nasopharyngeal Swab     Status: Abnormal   Collection Time: 02/20/21  6:17 PM   Specimen: Nasopharyngeal Swab; Nasopharyngeal(NP) swabs in vial transport medium  Result Value Ref Range Status   SARS Coronavirus 2 by RT PCR POSITIVE (A) NEGATIVE Final    Comment: RESULT CALLED TO, READ BACK BY AND VERIFIED WITH: APRIL BRUMGARD ON 02/20/21 AT 1926 QSD (NOTE) SARS-CoV-2 target nucleic acids are DETECTED.  The SARS-CoV-2 RNA is generally detectable in upper respiratory specimens during the acute phase of infection. Positive results are indicative of the presence of the identified virus, but do not rule out bacterial infection or co-infection with other pathogens not detected by the test. Clinical correlation with patient history and other diagnostic information is necessary to determine patient infection status. The expected result is Negative.  Fact Sheet for Patients: BloggerCourse.com  Fact Sheet for Healthcare Providers: SeriousBroker.it  This test is not yet approved or cleared by the [place] FDA and  has been authorized for detection and/or diagnosis of SARS-CoV-2 by FDA under an Emergency Use Authorization (EUA).  This EUA will remain in effect (meaning this test ca n be used) for the duration of  the COVID-19 declaration under Section 564(b)(1) of the Act, 21 U.S.C. section 360bbb-3(b)(1), unless the authorization is terminated or revoked sooner.     Influenza A by PCR NEGATIVE NEGATIVE Final   Influenza B by PCR NEGATIVE NEGATIVE Final    Comment: (NOTE) The Xpert Xpress SARS-CoV-2/FLU/RSV plus assay is intended as an aid in the diagnosis of influenza from Nasopharyngeal swab specimens and should not be used as a sole basis for treatment. Nasal washings and aspirates are unacceptable for Xpert Xpress SARS-CoV-2/FLU/RSV testing.  Fact Sheet for Patients: BloggerCourse.com  Fact Sheet for  Healthcare Providers: SeriousBroker.it  This test is not yet approved or cleared by the [place] FDA and has been authorized for detection and/or diagnosis of SARS-CoV-2 by FDA under an Emergency Use Authorization (EUA). This EUA will remain in effect (meaning this test can be used) for the duration of the COVID-19 declaration under Section 564(b)(1) of the Act, 21 U.S.C. section 360bbb-3(b)(1), unless the authorization is terminated or revoked.  Performed at [facility], [address], [place], [place] [address]      Time coordinating discharge: Over 30 minutes  SIGNED:   Lynn Ito, MD  [facility] 02/21/2021, 3:38 PM Pager   If 7PM-7AM, please contact night-coverage [email or web address] Password [email or web address]

## 2021-02-21 NOTE — Evaluation (Signed)
Physical Therapy Evaluation Patient Details Name: Elaine Aguilar MRN: [id number] DOB: 1981/04/23 Today's Date: 02/21/2021   History of Present Illness  Patient is a 40 year old female who presents with drifting to the left with ambulation, stuttering, and intermittent slurred speech. Has a cough and generalized weakness. Was COVID positive on 02/15/21. Patient found out on monday her brother was killed.    Clinical Impression  Patient is a pleasant 40 year old female who presents with ataxic movements and myoclonic jerking resulting in limited mobility and stability. Prior to hospital admission, pt was independent working as an Engineer, production for an elderly couple and lives alone in an apartment on the second floor (no Engineer, structural).  Currently pt demonstrates myoclonic jerking and ataxic movements with attempt at task initiation of RUE and RLE. Diffuse weakness in both right UE and right LE noted in comparison to left. Her speech additionally is impacted with frequent stuttering, attempt at word choice, and trembling of voice itself. She requires assistance to transfer to EOB with min A for RLE and trunk. Upon sitting EOB she leans left and posterior and requires UE support when attempting movement of RLE or UE. Attempt at stand results in near LOB with PT holding patient to prevent fall. As she ambulates with RW she has bilateral knee buckling with decreased stance time on RLE. Slight myoclonic and ataxic movements noted with patient being very unsafe requiring assistance for negotiation of room. Patient is not safe to return home as she is unstable, unable to negotiate stairs, and has diffuse physical deficits that place her at high risk for re-admission and falls.  Pt would benefit from skilled PT to address noted impairments and functional limitations (see below for any additional details).  Upon hospital discharge, pt would benefit from CIR placement due to high level of function prior to admission and severity of  deficits at this time.     Follow Up Recommendations CIR    Equipment Recommendations  Rolling walker with 5" wheels;Other (comment) (pending CIR acceptance.)    Recommendations for Other Services OT consult;Speech consult;Rehab consult     Precautions / Restrictions Precautions Precautions: Fall Restrictions Weight Bearing Restrictions: No      Mobility  Bed Mobility Overal bed mobility: Needs Assistance Bed Mobility: Rolling;Supine to Sit;Sit to Supine Rolling: Modified independent (Device/Increase time)   Supine to sit: Mod assist Sit to supine: Mod assist   General bed mobility comments: Patient requires assistance for RLE into/out of bed. Requires hand held assist for transition to EOB.    Transfers Overall transfer level: Needs assistance Equipment used: Rolling walker (2 wheeled) Transfers: Sit to/from Stand Sit to Stand: Min assist;Mod assist         General transfer comment: initial STS requires min/modA with bilateral knee buckling resulinting in need for PT to stabilize patient.  Ambulation/Gait Ambulation/Gait assistance: Mod assist;Min assist Gait Distance (Feet): 30 Feet Assistive device: Rolling walker (2 wheeled) Gait Pattern/deviations: Step-to pattern;Decreased stance time - right;Decreased stride length;Ataxic Gait velocity: decreased   General Gait Details: choreo/dancing movements in combination with ataxic and bilateral LE buckling  Stairs            Wheelchair Mobility    Modified Rankin (Stroke Patients Only)       Balance Overall balance assessment: Needs assistance Sitting-balance support: Single extremity supported;Feet supported Sitting balance-Leahy Scale: Fair Sitting balance - Comments: able to center self but requires UE support when attempting to move RLE or RUE Postural control: Left lateral lean;Posterior  lean Standing balance support: Bilateral upper extremity supported;During functional activity Standing  balance-Leahy Scale: Poor Standing balance comment: Requires use of RW for ambulation with assistance, upon attempt to stand without UE support has bilateral LE buckling resulting in near LOB requiring PT to hold patient upright.                             Pertinent Vitals/Pain Pain Assessment: No/denies pain    Home Living Family/patient expects to be discharged to:: Private residence Living Arrangements: Alone   Type of Home: Apartment Home Access: Stairs to enter Entrance Stairs-Rails: Right Entrance Stairs-Number of Steps: full flight Home Layout: One level Home Equipment: None Additional Comments: Patient is independent at baseline, no AD no equipment. Lives on a second floor in an apartment, no elevator available.    Prior Function Level of Independence: Independent         Comments: works as an Engineer, production for an elderly couple.     Hand Dominance        Extremity/Trunk Assessment   Upper Extremity Assessment Upper Extremity Assessment: Defer to OT evaluation;Generalized weakness (L WFL, RUE grossly 2+/5 with ataxic choreo movements)    Lower Extremity Assessment Lower Extremity Assessment: RLE deficits/detail;LLE deficits/detail RLE Deficits / Details: grossly 2+/5 highly ataxic with jerking choreo movements. RLE Sensation: decreased light touch RLE Coordination: decreased gross motor LLE Deficits / Details: grossly 4+/5 LLE Sensation: WNL LLE Coordination: WNL    Cervical / Trunk Assessment Cervical / Trunk Assessment: Normal  Communication   Communication: Expressive difficulties  Cognition Arousal/Alertness: Awake/alert Behavior During Therapy: WFL for tasks assessed/performed Overall Cognitive Status: Within Functional Limits for tasks assessed                                 General Comments: A and O x4, eager to participate in therapy      General Comments General comments (skin integrity, edema, etc.): patient appears  well groomed and well nourished.    Exercises General Exercises - Lower Extremity Ankle Circles/Pumps: Strengthening;Both;10 reps;Supine Heel Slides: Strengthening;Both;10 reps;Supine;AAROM (use of towel for RLE) Hip ABduction/ADduction: AAROM;Strengthening;Both;10 reps;Supine Straight Leg Raises: Strengthening;Both;5 reps;Supine;AAROM (assistance for RLE) Other Exercises Other Exercises: Patient educated on role of PT in acute care setting, safe mobility and transfers, and decreased fall risk strategies. Patient educated on supine strengthening interventions to perform independently.   Assessment/Plan    PT Assessment Patient needs continued PT services  PT Problem List Decreased strength;Decreased activity tolerance;Decreased balance;Decreased knowledge of use of DME;Decreased coordination;Decreased mobility       PT Treatment Interventions DME instruction;Gait training;Stair training;Functional mobility training;Therapeutic activities;Patient/family education;Neuromuscular re-education;Balance training;Therapeutic exercise;Manual techniques    PT Goals (Current goals can be found in the Care Plan section)  Acute Rehab PT Goals Patient Stated Goal: to get strong again so that I can move like I did before this happened. PT Goal Formulation: With patient Time For Goal Achievement: 03/07/21 Potential to Achieve Goals: Fair    Frequency 7X/week   Barriers to discharge Inaccessible home environment;Decreased caregiver support Patient is not safe to return home at this time. If she was to return home at this time she is at high risk for re-admission    Co-evaluation               AM-PAC PT "6 Clicks" Mobility  Outcome Measure Help needed turning from your back  to your side while in a flat bed without using bedrails?: A Little Help needed moving from lying on your back to sitting on the side of a flat bed without using bedrails?: A Lot Help needed moving to and from a bed to a  chair (including a wheelchair)?: A Little Help needed standing up from a chair using your arms (e.g., wheelchair or bedside chair)?: A Little Help needed to walk in hospital room?: A Lot Help needed climbing 3-5 steps with a railing? : Total 6 Click Score: 14    End of Session Equipment Utilized During Treatment: Gait belt Activity Tolerance: Patient tolerated treatment well;Patient limited by fatigue Patient left: in bed;with call bell/phone within reach;with bed alarm set Nurse Communication: Mobility status;Other (comment) (CIR recommendation) PT Visit Diagnosis: Unsteadiness on feet (R26.81);Other abnormalities of gait and mobility (R26.89);Muscle weakness (generalized) (M62.81);Ataxic gait (R26.0);Difficulty in walking, not elsewhere classified (R26.2);Other symptoms and signs involving the nervous system (R29.898)    Time: 5993-5701 PT Time Calculation (min) (ACUTE ONLY): 26 min   Charges:   PT Evaluation $PT Eval Moderate Complexity: 1 Mod PT Treatments $Therapeutic Activity: 8-22 mins      Precious Bard, PT, DPT   02/21/2021, 4:25 PM

## 2021-02-22 LAB — COMPREHENSIVE METABOLIC PANEL
ALT: 62 U/L — ABNORMAL HIGH (ref 0–44)
AST: 43 U/L — ABNORMAL HIGH (ref 15–41)
Albumin: 3.8 g/dL (ref 3.5–5.0)
Alkaline Phosphatase: 47 U/L (ref 38–126)
Anion gap: 8 (ref 5–15)
BUN: 10 mg/dL (ref 6–20)
CO2: 25 mmol/L (ref 22–32)
Calcium: 8.8 mg/dL — ABNORMAL LOW (ref 8.9–10.3)
Chloride: 105 mmol/L (ref 98–111)
Creatinine, Ser: 0.48 mg/dL (ref 0.44–1.00)
GFR, Estimated: >60 mL/min (ref >60)
Glucose, Bld: 97 mg/dL (ref 70–99)
Potassium: 4 mmol/L (ref 3.5–5.1)
Sodium: 138 mmol/L (ref 135–145)
Total Bilirubin: 0.6 mg/dL (ref 0.3–1.2)
Total Protein: 6.9 g/dL (ref 6.5–8.1)

## 2021-02-22 NOTE — TOC Initial Note (Signed)
Transition of Care [facility]) - Initial/Assessment Note    Patient Details  Name: Elaine Aguilar MRN: [id number] Date of Birth: September 15, 1981  Transition of Care [facility]) CM/SW Contact:    Allayne Butcher, RN Phone Number: 02/22/2021, 10:07 AM  Clinical Narrative:                 Patient placed under observation for acute metabolic encephalopathy.  Patient is medically cleared for discharge.  PT was recommending CIR but CIR is unable to take due to observation status in hospital.  Patient is okay with going home with home health services.  [facility] given charity referral for PT and OT.  Patient is current with [facility].  RNCM will provide patient with TOC donated walker.  Family will be coming to pick patient up and she will be staying with her cousin.     Expected Discharge Plan: Home w Home Health Services Barriers to Discharge: No Barriers Identified   Patient Goals and CMS Choice Patient states their goals for this hospitalization and ongoing recovery are:: Patient feels ready to go home and will have help from family CMS Medicare.gov Compare Post Acute Care list provided to:: Patient Choice offered to / list presented to : Patient  Expected Discharge Plan and Services Expected Discharge Plan: Home w Home Health Services   Discharge Planning Services: CM Consult Post Acute Care Choice: Home Health Living arrangements for the past 2 months: Apartment Expected Discharge Date: 02/22/21               DME Arranged: Dan Humphreys rolling DME Agency: Other - Comment     Representative spoke with at DME Agency: TOC donated item HH Arranged: PT,OT HH Agency: [facility] Date HH Agency Contacted: 02/22/21 Time HH Agency Contacted: 1004 Representative spoke with at [facility] Agency: Barbara Cower  Prior Living Arrangements/Services Living arrangements for the past 2 months: Apartment Lives with:: Self Patient language and need for interpreter reviewed:: Yes Do you feel safe going back to  the place where you live?: Yes      Need for Family Participation in Patient Care: Yes (Comment) Care giver support system in place?: Yes (comment)   Criminal Activity/Legal Involvement Pertinent to Current Situation/Hospitalization: No - Comment as needed  Activities of Daily Living Home Assistive Devices/Equipment: None ADL Screening (condition at time of admission) Patient's cognitive ability adequate to safely complete daily activities?: Yes Is the patient deaf or have difficulty hearing?: No Does the patient have difficulty seeing, even when wearing glasses/contacts?: No Does the patient have difficulty concentrating, remembering, or making decisions?: Yes Patient able to express need for assistance with ADLs?: Yes Does the patient have difficulty dressing or bathing?: Yes Independently performs ADLs?: No Communication: Independent Dressing (OT): Needs assistance Is this a change from baseline?: Change from baseline, expected to last <3days Grooming: Needs assistance Is this a change from baseline?: Change from baseline, expected to last <3 days Feeding: Independent Bathing: Independent Toileting: Independent In/Out Bed: Needs assistance Is this a change from baseline?: Change from baseline, expected to last <3 days Walks in Home: Independent Does the patient have difficulty walking or climbing stairs?: Yes Weakness of Legs: Both Weakness of Arms/Hands: Both  Permission Sought/Granted Permission sought to share information with : Case Manager,Other (comment) Permission granted to share information with : Yes, Verbal Permission Granted     Permission granted to share info w AGENCY: [facility]        Emotional Assessment   Attitude/Demeanor/Rapport: Engaged Affect (  typically observed): Accepting Orientation: : Oriented to Self,Oriented to Place,Oriented to  Time,Oriented to Situation Alcohol / Substance Use: Not Applicable Psych Involvement: No  (comment)  Admission diagnosis:  Cough [R05.9] Spell of abnormal behavior [R46.89] Encephalopathy due to COVID-19 virus [U07.1, G93.49] Patient Active Problem List   Diagnosis Date Noted  . Conversion disorder   . Grief reaction   . COVID-19 virus infection 02/20/2021  . Transaminitis 02/20/2021  . Acute metabolic encephalopathy 02/20/2021  . Transient neurologic deficit 02/20/2021  . Acute cholecystitis 07/19/2018   PCP:  [facility] Pharm[facility]:   [facility] [place], [place] - [address] [place] [address] Phone: [phone] Fax: [phone]  [facility] [phone] - [address] [place] - [phone] [place] [address] Phone: [phone] Fax: [phone]     Social Determinants of Health (SDOH) Interventions    Readmission Risk Interventions No flowsheet data found.

## 2021-02-22 NOTE — Progress Notes (Signed)
Physical Therapy Treatment Patient Details Name: Elaine Aguilar MRN: [id number] DOB: 09/07/1981 Today's Date: 02/22/2021    History of Present Illness Patient is a 40 year old female who presents with drifting to the left with ambulation, stuttering, and intermittent slurred speech. Has a cough and generalized weakness. Was COVID positive on 02/15/21. Patient found out on monday her brother was killed.    PT Comments    Patient with noted improvement in R UE/LE strength and motor control this date.  Initially, with mild buckling to R knee during gait efforts; improved with cuing for sustained R knee flexion in loading phase of gait.  Able to complete household-distance gait with RW, cga/close sup; simulated stairs (with bilat UEs on elevated footboard of bed), cga/min assist.  Comfortable with current functional performance and level of assist required at discharge.  RW adjusted to appropriate height; gait belt issued for home use. Given noted improvement in functional ability, okay for discharge home with [facility] follow up (planning to discharge to cousin's home; single-story with 3 steps to enter/exit).  Recs updated to reflect.     Follow Up Recommendations  Home health PT     Equipment Recommendations  Rolling walker with 5" wheels    Recommendations for Other Services       Precautions / Restrictions Precautions Precautions: Fall Restrictions Weight Bearing Restrictions: No    Mobility  Bed Mobility Overal bed mobility: Needs Assistance             General bed mobility comments: seated edge of bed upon arrival to room/end of session    Transfers Overall transfer level: Needs assistance Equipment used: Rolling walker (2 wheeled) Transfers: Sit to/from Stand Sit to Stand: Min guard;Supervision         General transfer comment: cuing for hand placement to prevent pulling on RW  Ambulation/Gait Ambulation/Gait assistance: Min guard;Supervision Gait Distance (Feet):   (30' x3) Assistive device: Rolling walker (2 wheeled)       General Gait Details: partially reciprocal stepping pattern, min/mod reliance on RW.  Initially, demonstrates R knee recurvatum with unlocking/buckling (but self-corrects buckle) and inconsistent R foot clearance, foot placement; however, with cuing for R knee control (encouraged to keep very slight bend in knee to minimize recurvatum), noted improvement in gait fluidity, mechanics and overall safety/stability.   Stairs         General stair comments: Simulated stairs with bilat UEs on footboard (to simulate L ascending handrail), cga/min assist for safety.  Min cuing for technique; fair/good control of bilat LEs   Wheelchair Mobility    Modified Rankin (Stroke Patients Only)       Balance Overall balance assessment: Needs assistance Sitting-balance support: No upper extremity supported;Feet supported Sitting balance-Leahy Scale: Normal     Standing balance support: Bilateral upper extremity supported Standing balance-Leahy Scale: Fair                              Cognition Arousal/Alertness: Awake/alert Behavior During Therapy: WFL for tasks assessed/performed Overall Cognitive Status: Within Functional Limits for tasks assessed                                        Exercises Other Exercises Other Exercises: Toilet transfer, ambulatory with RW, cga/close sup; sit/stand from standard toilet, cga/close sup, with grab bar and RW; standing balance for hygiene,  peri-care and lower body clothing management, close sup.  Fair/good awareness of limits of stability, self-initiating use of compensatory strategies as needed Other Exercises: Verbally reviewed technique for car transfers and safety; patient voiced understanding and awareness. Other Exercises: Charity RW adjusted to appropriate height; gait belt issued for home use.    General Comments        Pertinent Vitals/Pain Pain  Assessment: No/denies pain    Home Living                      Prior Function            PT Goals (current goals can now be found in the care plan section) Acute Rehab PT Goals Patient Stated Goal: to get strong again so that I can move like I did before this happened. PT Goal Formulation: With patient Time For Goal Achievement: 03/07/21 Potential to Achieve Goals: Good Progress towards PT goals: Progressing toward goals    Frequency    7X/week      PT Plan Discharge plan needs to be updated    Co-evaluation              AM-PAC PT "6 Clicks" Mobility   Outcome Measure  Help needed turning from your back to your side while in a flat bed without using bedrails?: None Help needed moving from lying on your back to sitting on the side of a flat bed without using bedrails?: None Help needed moving to and from a bed to a chair (including a wheelchair)?: A Little Help needed standing up from a chair using your arms (e.g., wheelchair or bedside chair)?: A Little Help needed to walk in hospital room?: A Little Help needed climbing 3-5 steps with a railing? : A Little 6 Click Score: 20    End of Session Equipment Utilized During Treatment: Gait belt Activity Tolerance: Patient tolerated treatment well Patient left: in bed;with call bell/phone within reach Nurse Communication: Mobility status PT Visit Diagnosis: Unsteadiness on feet (R26.81);Other abnormalities of gait and mobility (R26.89);Muscle weakness (generalized) (M62.81);Ataxic gait (R26.0);Difficulty in walking, not elsewhere classified (R26.2);Other symptoms and signs involving the nervous system (F81.829)     Time: 9371-6967 PT Time Calculation (min) (ACUTE ONLY): 29 min  Charges:  $Gait Training: 8-22 mins $Therapeutic Activity: 8-22 mins                     Elaine Carvin H. Manson Aguilar, PT, DPT, NCS 02/22/21, 3:31 PM [phone]

## 2022-01-15 IMAGING — CT CT HEAD W/O CM
3 series · 15 of 47 positions shown, 18 images · non-contrast
Comparison: None.

CLINICAL DATA: Altered mental status

EXAM:
CT HEAD WITHOUT CONTRAST
TECHNIQUE: Contiguous axial images were obtained from the base of the skull
through the vertex without intravenous contrast.

[Series 3: coronal soft tissue · coronal · 0.28mm/px · 3 of 67 slices shown]
[im 23/67  brain]
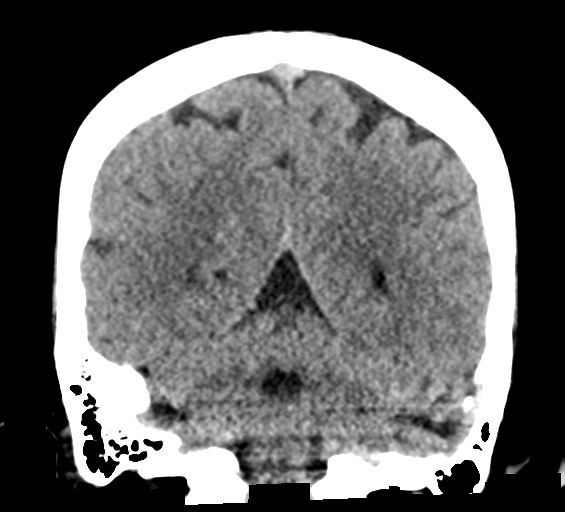
[im 30/67  brain]
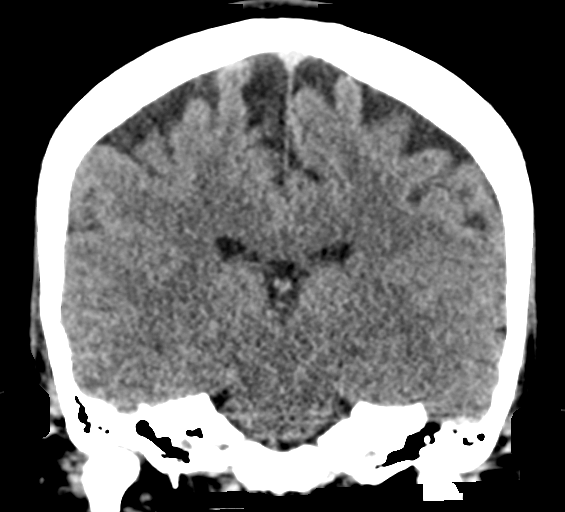
[im 37/67  brain]
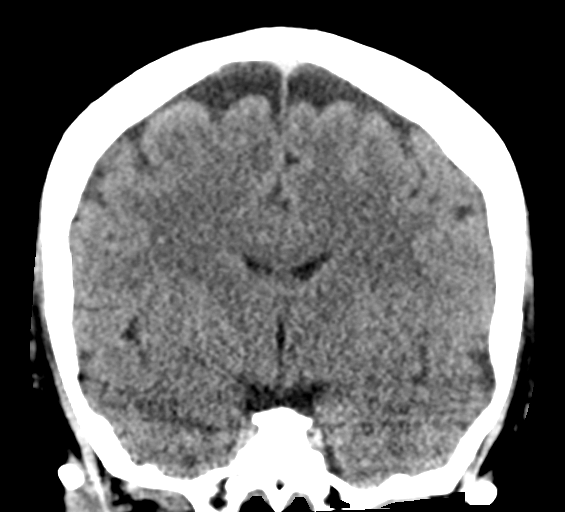

[Series 4: sagittal soft tissue · sagittal · 0.28mm/px · 3 of 54 slices shown]
[im 18/54  brain]
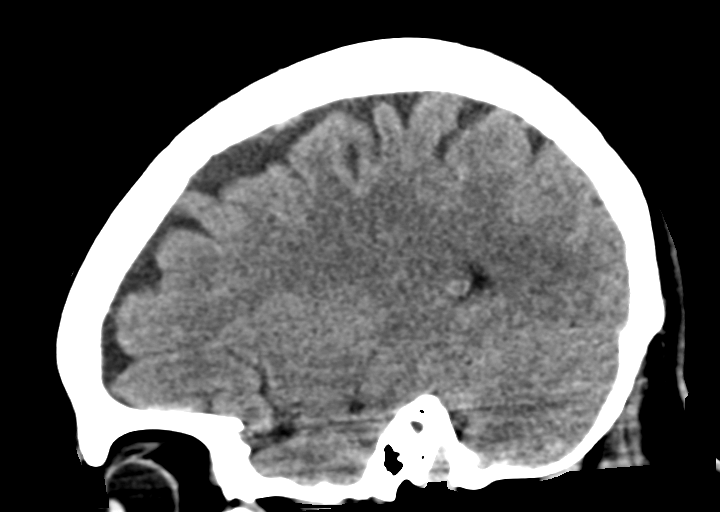
[im 27/54  brain]
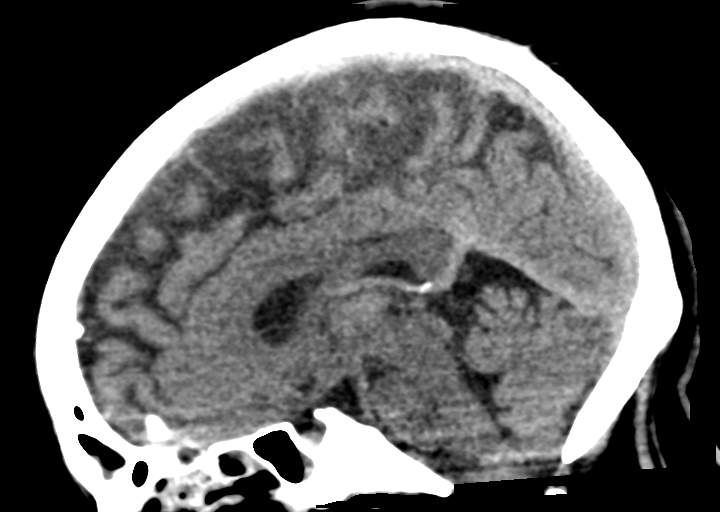
[im 36/54  brain]
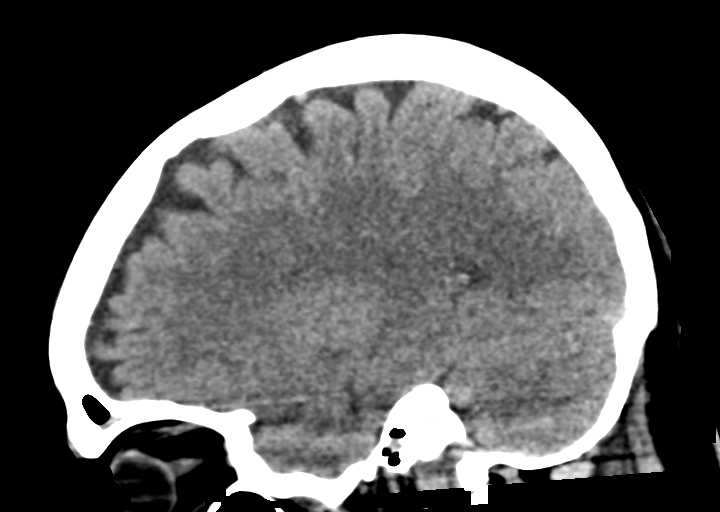

[Series 5: head wo · axial · 0.46mm/px · z∈[-157,-32]mm · 9 of 30 slices shown, 12 images]
[im 3/30  brain]
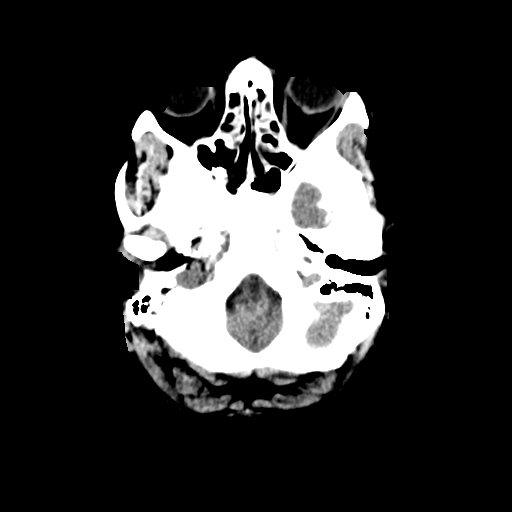
[im 3/30  bone]
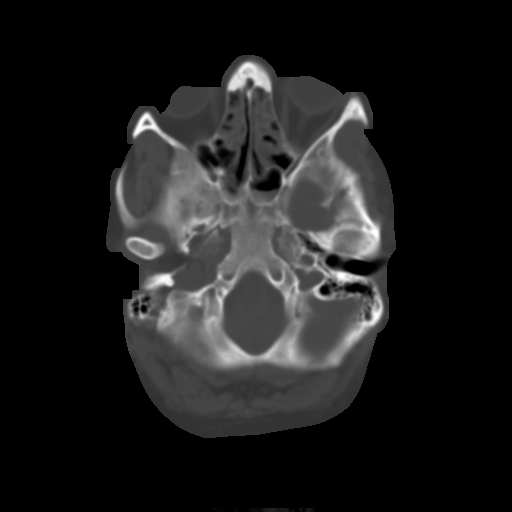
[im 6/30  brain]
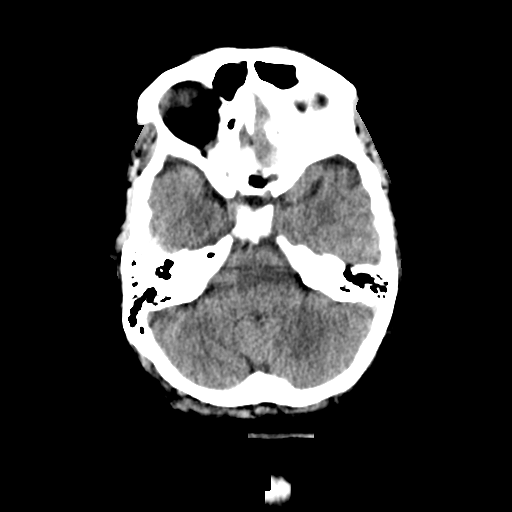
[im 9/30  brain]
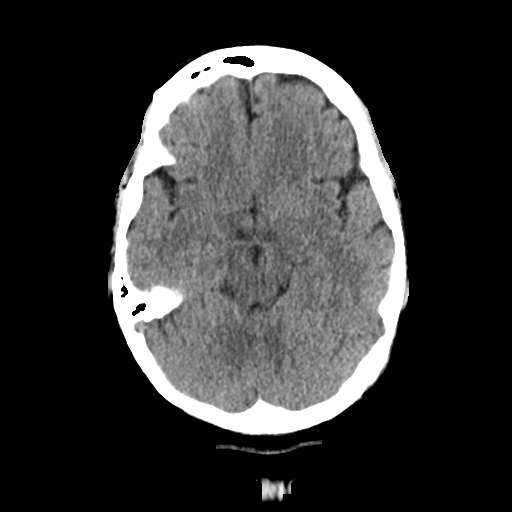
[im 12/30  brain]
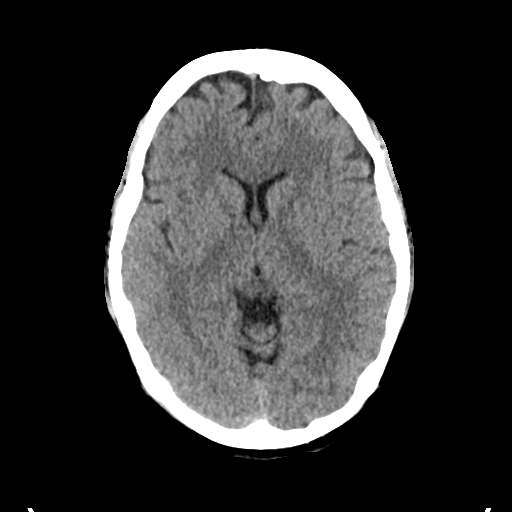
[im 16/30  brain]
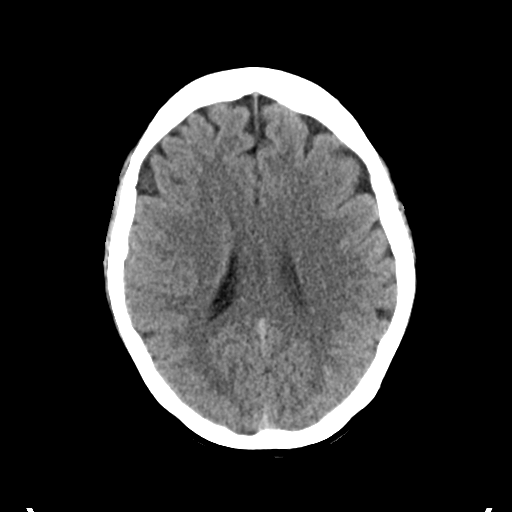
[im 16/30  bone]
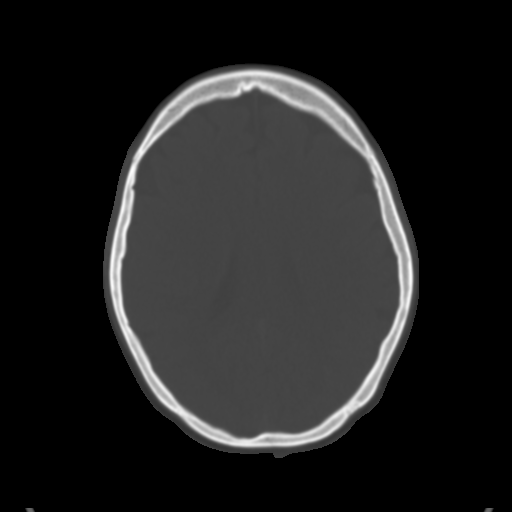
[im 19/30  brain]
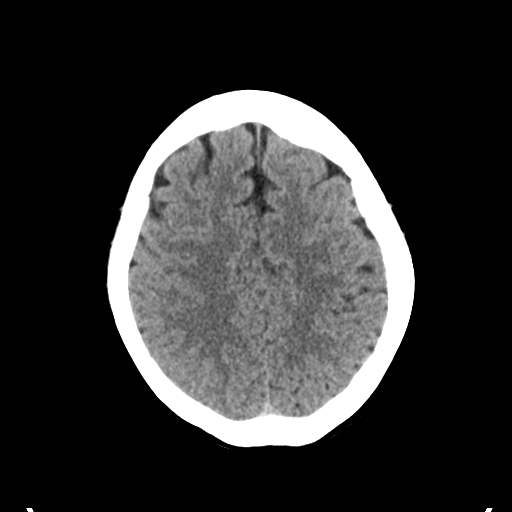
[im 22/30  brain]
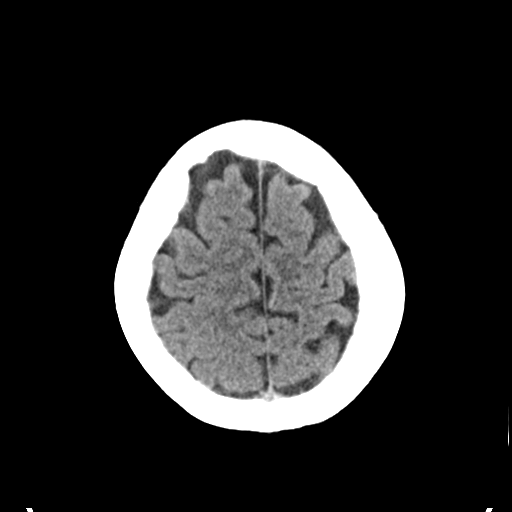
[im 25/30  brain]
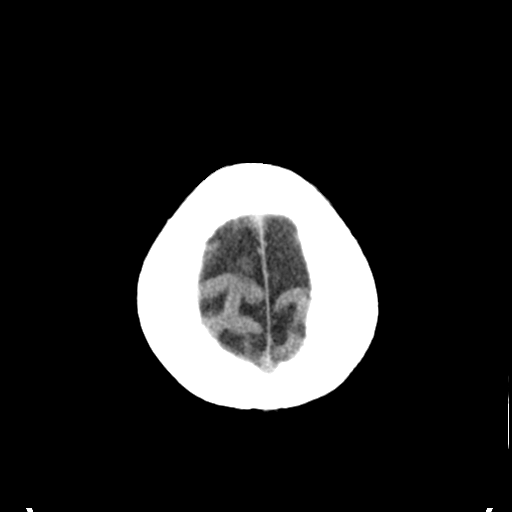
[im 28/30  brain]
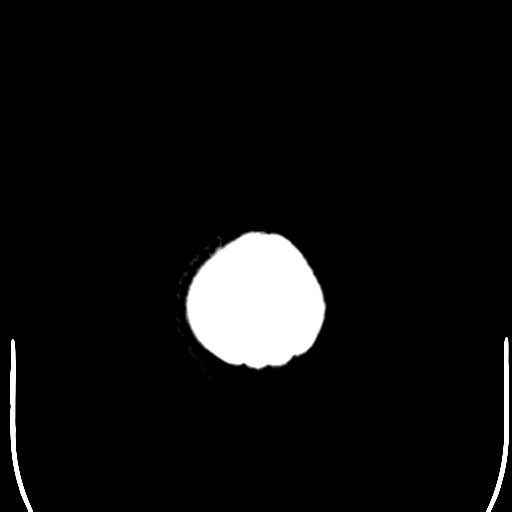
[im 28/30  bone]
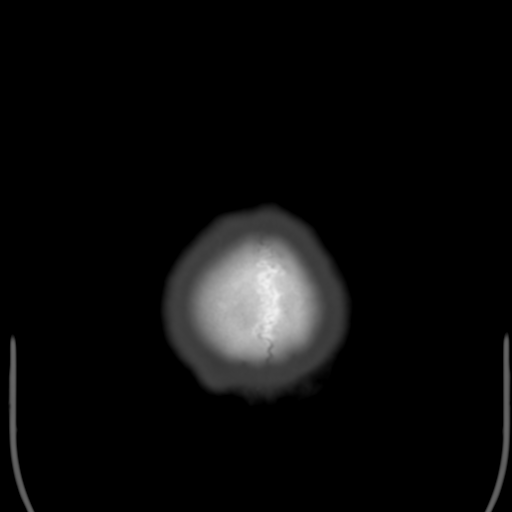

[15 of 47 positions shown; findings below may reference images not displayed]

FINDINGS: Brain: No evidence of acute infarction, hemorrhage, hydrocephalus,
extra-axial collection or mass lesion/mass effect.

Vascular: No hyperdense vessel or unexpected calcification.

Skull: Normal. Negative for fracture or focal lesion.

Sinuses/Orbits: Mucosal thickening of the frontal sinuses, sphenoid
sinuses, right maxillary sinus and ethmoid air cells

Other: Mastoid air cells are predominantly clear.
IMPRESSION: 1. No acute intracranial abnormality.
2. Paranasal sinus disease.
